# Patient Record
Sex: Female | Born: 1970
Health system: Southern US, Community
[De-identification: ages and names within clinical notes are randomized; demographics above are authoritative.]

## PROBLEM LIST (undated history)

## (undated) DIAGNOSIS — L0291 Cutaneous abscess, unspecified: Secondary | ICD-10-CM

## (undated) DIAGNOSIS — L0390 Cellulitis, unspecified: Secondary | ICD-10-CM

## (undated) DIAGNOSIS — J309 Allergic rhinitis, unspecified: Secondary | ICD-10-CM

## (undated) DIAGNOSIS — Z86718 Personal history of other venous thrombosis and embolism: Secondary | ICD-10-CM

## (undated) DIAGNOSIS — I10 Essential (primary) hypertension: Secondary | ICD-10-CM

## (undated) HISTORY — PX: CHOLECYSTECTOMY: SHX55

## (undated) HISTORY — PX: ABDOMINAL HYSTERECTOMY: SHX81

## (undated) HISTORY — DX: Essential (primary) hypertension: I10

## (undated) HISTORY — DX: Personal history of other venous thrombosis and embolism: Z86.718

## (undated) HISTORY — DX: Allergic rhinitis, unspecified: J30.9

## (undated) HISTORY — PX: KNEE SURGERY: SHX244

---

## 2004-06-03 ENCOUNTER — Encounter (INDEPENDENT_AMBULATORY_CARE_PROVIDER_SITE_OTHER): Payer: Self-pay | Admitting: *Deleted

## 2004-06-03 ENCOUNTER — Inpatient Hospital Stay (HOSPITAL_COMMUNITY): Admission: RE | Admit: 2004-06-03 | Discharge: 2004-06-06 | Payer: Self-pay | Admitting: Obstetrics & Gynecology

## 2006-06-07 ENCOUNTER — Encounter: Admission: RE | Admit: 2006-06-07 | Discharge: 2006-06-07 | Payer: Self-pay | Admitting: Obstetrics & Gynecology

## 2006-08-12 ENCOUNTER — Encounter (INDEPENDENT_AMBULATORY_CARE_PROVIDER_SITE_OTHER): Payer: Self-pay | Admitting: *Deleted

## 2006-08-12 ENCOUNTER — Inpatient Hospital Stay (HOSPITAL_COMMUNITY): Admission: RE | Admit: 2006-08-12 | Discharge: 2006-08-15 | Payer: Self-pay | Admitting: Obstetrics & Gynecology

## 2010-04-18 ENCOUNTER — Ambulatory Visit: Payer: Self-pay | Admitting: Internal Medicine

## 2010-11-14 NOTE — Discharge Summary (Signed)
NAMEADELAI, ACHEY              ACCOUNT NO.:  [id number]   MEDICAL RECORD NO.:  [id number]          PATIENT TYPE:  INP   LOCATION:  9308                          FACILITY:  [facility]   PHYSICIAN:  Freddy Finner, M.D.   DATE OF BIRTH:  01/06/1971   DATE OF ADMISSION:  06/03/2004  DATE OF DISCHARGE:  06/06/2004                                 DISCHARGE SUMMARY   DISCHARGE DIAGNOSIS:  Multiple uterine leiomyomata with total weight of  myomas removed 526 g.   OPERATIVE PROCEDURE:  Myomectomy.   POSTOPERATIVE COMPLICATIONS:  None, although the patient did experience some  slow return of bowel function.   DISPOSITION:  The patient was discharged on postoperative day #3 at which  time she was having adequate bowel and bladder function.  She was ambulating  without difficulty.  She was tolerating a regular diet.  She was discharged  home with Percocet 5/325 mg to be taken as needed for postoperative pain.  She is to continue multivitamins and iron supplement.  She is to follow up  in the office in approximately 2 weeks.  She is to call for fever, for any  drainage or bleeding from the incision, or any major vaginal bleeding.   Details of the present illness, past history, family history, review of  systems, and physical exam recorded in the admission note and/or in the  operative summary.  The patient was found to have a very large uterus with  what at least by ultrasound appeared to be a single large myoma.  She had  been advised to have a hysterectomy by another physician and wished a second  opinion.  After consultation, we have elected to admit her now for  myomectomy as an option.   Laboratory data during this admission includes admission hemoglobin of 12.2,  postoperative hemoglobin of 11.1 on postoperative day #1, 11.4 on  postoperative day #2, 10.8 on postoperative day #3.  Her admission  prothrombin time and PTT and urinalysis were all normal.   The patient was admitted on the  morning of surgery.  She was taken to the  operating room where an exploratory laparotomy was performed with findings  of numerous leiomyomas, not just a single one as anticipated by ultrasound.  A large number of myomas were then excised; in fact, all that were palpable  or visible were removed.  Total mass of that removed tissue 526 g.  Her  postoperative course was uncomplicated although she did have fairly slow  return of GI function which required her an additional hospital day.  She  did, however, remain completely afebrile throughout.  Her incision was  healing well throughout her postoperative stay.  By the morning of  postoperative day #3 her condition was considered to be good and she was  discharged home with disposition as noted above.     Hosie Spangle   WRN/MEDQ  D:  07/03/2004  T:  07/03/2004  Job:  [id number]

## 2010-11-14 NOTE — H&P (Signed)
[name], [name]              ACCOUNT NO.:  [id number]   MEDICAL RECORD NO.:  [id number]          PATIENT TYPE:  INP   LOCATION:  NA                            FACILITY:  [facility]   PHYSICIAN:  Freddy Finner, M.D.   DATE OF BIRTH:  [birth year]   DATE OF ADMISSION:  DATE OF DISCHARGE:                                HISTORY & PHYSICAL   DATE OF ADMISSION:  June 03, 2004   ADMITTING DIAGNOSIS:  Large uterine leiomyoma.   The patient is a 40 year old black single female gravida 1 para 1 who has  been known to have a uterine leiomyoma for approximately 2 years.  She  presented to my office first in September 2005 after it was recommended that  she have a hysterectomy.  She was looking for another option.  Pelvic  ultrasound in the office on that day showed a single intramural myoma  measuring 6.3 x 5.6 cm.  No other myomas were identified.  The additional  option of a myomectomy was discussed with the patient and she has elected to  proceed with this procedure.  She is now admitted for that purpose.  The  small possibility of a hysterectomy has been discussed with the patient in  the presence of hemorrhage requiring this for her safety.   Her current review of systems is otherwise negative.   PAST MEDICAL HISTORY:  The patient has no known significant medical  illnesses.  She did have a cholecystectomy in 1997.  She has had one vaginal  birth.  She has had no other operative procedures.  She has no known  allergies to medications.  She is not a cigarette smoker, does not use drugs  or alcohol.  Her only chronic medication is iron replacement.   FAMILY HISTORY:  Diabetes mellitus is present in the maternal grandmother,  paternal grandmother, and paternal grandfather.  Breast cancer is present in  a maternal great-grandmother.  Heart disease is present in paternal  grandfather, who died with an MI.  Maternal grandmother has some form of  dementia, questionably Alzheimer's.   PHYSICAL EXAMINATION:  HEENT:  Grossly within normal limits.  Thyroid gland  is not palpably enlarged.  VITAL SIGNS:  Blood pressure in the office was 122/84.  CHEST:  Clear to auscultation.  HEART:  Normal sinus rhythm without murmurs, rubs, or gallops.  ABDOMEN:  Soft and nontender without appreciable organomegaly or palpable  masses.  Exam was somewhat compromised by obesity.  PELVIC:  External genitalia, vagina, and cervix are normal to inspection.  Bimanual reveals the uterus to be irregular and 14 weeks size.  There are no  palpable adnexal masses.  The rectal is palpably normal and rectovaginal  exam confirms the above findings.   ASSESSMENT:  Uterine leiomyoma.   PLAN:  Myomectomy.     [name]   WRN/MEDQ  D:  06/02/2004  T:  06/02/2004  Job:  [id number]

## 2010-11-14 NOTE — Discharge Summary (Signed)
[name], [name]              ACCOUNT NO.:  [id number]   MEDICAL RECORD NO.:  [id number]          PATIENT TYPE:  INP   LOCATION:  9132                          FACILITY:  [facility]   PHYSICIAN:  Zelphia Cairo, MD    DATE OF BIRTH:  December 02, 1970   DATE OF ADMISSION:  08/12/2006  DATE OF DISCHARGE:  08/15/2006                               DISCHARGE SUMMARY   ADMITTING DIAGNOSES:  1. Intrauterine pregnancy at term.  2. Surgically scarred uterus from multiple myomectomies.  3. Recurrent leiomyomata.  4. Request for definitive surgical procedure, specifically cesarean      hysterectomy.   DISCHARGE DIAGNOSIS:  Status post cesarean delivery and hysterectomy  with viable female infant.   PROCEDURES:  Cesarean delivery and hysterectomy.   REASON FOR ADMISSION:  Please see dictated H&P.   HOSPITAL COURSE:  The patient was a 40 year old, gravida 2, para 1, who  had multiple myomectomies in the past and was now admitted for surgical  delivery secondary to previous myomectomy.  The patient was at term.  She had also had documented enlargement of the uterus with additional  fibroids at the time of her admission.  The patient was very certain she  did not desire any further pregnancies and had requested a definitive  surgical intervention, specifically requested a hysterectomy at the time  of the cesarean delivery.  On the morning of admission, the patient was taken to the operating room  where spinal anesthesia was administered without difficulty.  A low  transverse incision was made with the delivery of a viable female infant,  weighing 8 pounds 6 ounces, Apgar's of 8 at one minute and 9 at five  minutes.  The patient did undergo a hysterectomy and tolerated procedure  well and was taken to the recovery room in stable condition.  On postoperative day #1, the patient was without complaint.  Vital signs  were stable.  She was afebrile.  The patient did experience some  hypothermia and was  applied a bear hug blanket.  Temperature was now  normal.  On the following morning, abdomen soft with decreased bowel sounds.  Abdominal dressing was noted to be clean, dry, and intact.  The patient  was ambulating well.  Foley had been discontinued.  However, she had not  voided at the time of rounding.  Laboratory findings revealed hemoglobin  stable at 9.2, platelet count 136,000, WBC count of 6.3.  On postoperative day #2, the patient was without complaint.  Vital signs  were stable.  She was afebrile.  Blood pressure 130s over 70s to 80s.  Abdomen soft with good return of bowel function.  Abdominal dressing had  been removed revealing an incision that was clean, dry, and intact.  On postoperative day #3, the patient was without complaint.  Vital signs  remained stable.  She was afebrile.  Abdomen soft.  Incision clean, dry,  and intact.  Instructions were reviewed and the patient was later  discharged home.   CONDITION ON DISCHARGE:  Good.   DIET:  Regular as tolerated.   ACTIVITY:  No heavy lifting, no driving x2  weeks, no vaginal entry.   FOLLOWUP:  Patient to follow up in the office in 1 week for an incision  check.  She is to call for a temperature greater than 100 degrees,  persistent nausea, vomiting, heavy vaginal bleeding, and/or redness or  drainage from the incisional site.   DISCHARGE MEDICATIONS:  1. Percocet 5/325, #30, one p.o. every four to six hours p.r.n.  2. Motrin 600 mg every 6 hours as needed.  3. Prenatal vitamins one p.o. daily.  4. Colace one p.o. daily p.r.n.      Julio Sicks, N.P.      Zelphia Cairo, MD  Electronically Signed    CC/MEDQ  D:  09/17/2006  T:  09/17/2006  Job:  [id number]

## 2010-11-14 NOTE — H&P (Signed)
[name], BOCCHINO              ACCOUNT NO.:  [id number]   MEDICAL RECORD NO.:  [id number]          PATIENT TYPE:  INP   LOCATION:  NA                            [facility]   PHYSICIAN:  Freddy Finner, M.D.   DATE OF BIRTH:  1970/09/26   DATE OF ADMISSION:  DATE OF DISCHARGE:  08/12/2006                              HISTORY & PHYSICAL   ADMITTING DIAGNOSES:  1. Intrauterine pregnancy at term.  2. Surgically scarred uterus from multiple myomectomies.  3. Recurrent leiomyomata.  4. Request for definitive surgical procedure, specifically Cesarean      hysterectomy.   The patient is a 40 year old gravida 2, para 1, who had multiple  myomectomies in the past and is admitted now at term for surgical  delivery because of a myomectomy.  She has documented enlargement of the  uterus with additional fibroids since that time.  She is very certain  that she wishes not to have more pregnancies and has requested  definitive surgical intervention, specifically requested hysterectomy at  the time of Cesarean.  After careful consultation, we have elected to  proceed with this procedure.  She is admitted at this time for that  purpose.   CURRENT REVIEW OF SYSTEMS:  Otherwise negative.  There are no cardiac,  pulmonary, GI, or GU complaints.   PAST MEDICAL HISTORY:  Recorded in detail on the prenatal summary.  Will  not be repeated at this time.   PHYSICAL EXAMINATION:  Blood pressure in the office 128/70.  HEENT:  Grossly within normal limits.  Thyroid gland is not palpably  enlarged.  CHEST:  Clear to auscultation.  HEART:  Normal sinus rhythm without murmurs, rubs, or gallops.  ABDOMEN:  Gravid.  Estimated fetal weight of 8 pounds.  EXTREMITIES:  Without cyanosis, clubbing, or edema.   ASSESSMENT:  1. Intrauterine pregnancy at term.  2. Surgically scarred uterus.  3. History of myomectomy with recurring leiomyomata.  4. Request for permanent sterilization and hysterectomy.   PLAN:  Cesarean, hysterectomy.      Freddy Finner, M.D.  Electronically Signed    WRN/MEDQ  D:  08/11/2006  T:  08/11/2006  Job:  [id number]

## 2010-11-14 NOTE — Op Note (Signed)
[name], [name]              ACCOUNT NO.:  [id number]   MEDICAL RECORD NO.:  [id number]          PATIENT TYPE:  INP   LOCATION:  9132                          [facility]ACILITY:  [facility]   PHYSICIAN:  [name], M.D.   DATE OF BIRTH:  1970-10-15   DATE OF PROCEDURE:  08/12/2006  DATE OF DISCHARGE:                               OPERATIVE REPORT   PREOPERATIVE DIAGNOSES:  1. Intrauterine pregnancy at term.  2. Surgically scarred uterus by previous myomectomy.  3. Recurrent uterine leiomyomata.  4. Multiparity and request for permanent sterilization and request for      hysterectomy.   POSTOPERATIVE DIAGNOSES:  1. Intrauterine pregnancy at term.  2. Surgically scarred uterus by previous myomectomy.  3. Recurrent uterine leiomyomata.  4. Multiparity and request for permanent sterilization and request for      hysterectomy.   OPERATIVE PROCEDURE:  Cesarean, hysterectomy. Delivery of viable female  infant, Apgars of 8 and 9.   ANESTHESIA:  Spinal.   INTRAOPERATIVE COMPLICATIONS:  None.   ESTIMATED INTRAOPERATIVE BLOOD LOSS:  1200 mL.   SURGEON:  [name].   ASSISTANTRenaldo [name].   DETAILS OF THE PRESENT ILLNESS:  According to the admission note, the  patient was admitted on the morning of surgery when she was brought to  the operating room, placed under adequate spinal anesthesia, placed in  the dorsal recumbent position. Abdomen was prepped and draped in the  usual fashion. Foley catheter placed using sterile technique, and  vaginal prep done today because of hysterectomy. A lower abdominal  transverse skin incision was made through an old transverse scar and  carried sharply down to fascia. A subcutaneous vessels were controlled  with the Bovie. Fascia was entered sharply and extended to the extent of  skin incision. Rectus sheath was developed superiorly and inferiorly  with blunt and sharp dissection. Rectus muscles were divided in the  midline. Peritoneum was elevated and  entered sharply and extended  bluntly to the extent of skin incision. Bladder blade was placed.  Transverse incision was made in the visceral peritoneum overlying the  lower uterine segment. Bladder bluntly was dissected off of the lower  segment. Transverse incision was made in the lower uterine segment and  carried sharply to the amniotic fluid which was clear. A Kiwi was then  used to deliver a viable female infant without significant difficulty.  Placenta was donated and was removed manually and passed from the table.  Uterus was then delivered onto the anterior abdominal wall. There were  dense adhesions to the posterior fundus of the ileum, and these were  taken down with the Bovie very carefully. There were adhesions of the  left ovary to the posterior uterine surface. These, too, were taken down  with the Bovie. The uterus was nodular, consistent with myomas. Proximal  broad ligaments were then clamped with large Kelly's. Progressive  pedicles were then developed on each side using curved Heaney's to  develop utero-ovarian pedicle, the broad ligament. Because of the  pregnancy, this required approximately 4 pedicles on each side. Each was  sharply divided and ligated with  suture ligature of 0 Monocryl. Bladder  flap was then carefully released, and the bladder advanced off of the  cervix and lower uterine segment. Vesical pedicles were taken with  curved Heaney's, divided sharply and ligated with 0 Monocryl. Straight  Heaney's were then used to develop the cardinal ligament pedicles which  were sharply divided and ligated with 0 Monocryl. The uterus was then  amputated from the cervix. Digital exploration of the cervix identified  the distal end of the cervix. An additional pedicle on each side was  required with straight Heaney's for complete resection of the cardinal  ligament pedicles, and these were suture ligated with 0 Monocryl. Curved  parametrium clamp was placed on the left  uterosacral across the angle of  the vagina. Using digital exam, the cervix was completely circumscribed  sharply and removed. A couple of small segments of cervix were remaining  on the mucosa and were removed. The vaginal angles were ligated with  suture ligature of 0 Monocryl. Cuff was closed with figure-of-eights of  0 Monocryl. Small bleeding sources were controlled with the Bovie.  Irrigation was carried out. Hemostasis was complete. The right fallopian  tube was in fact a blind tube with no fimbria visible, and for that  reason, it was removed. Two pedicles were required for this, requiring  free ties of 0 Monocryl or suture ligatures of 0 Monocryl. Also a small  right para-ovarian cyst was excised with the Bovie. The appendix could  not be visualized and was presumed to be retrocecal. All pack, needle  and instrument counts were correct. After irrigation, the abdominal  incision was closed in layers. Running 0 Monocryl was used to close the  peritoneum and reapproximate the rectus muscles. Fascia was closed with  running double looped 0 PDS. Subcutaneous tissue was approximated with  running 2-0 plain. Skin was closed with __________  skin staples. The  patient tolerated the operative procedure well and was taken to the  recovery room in good condition.      [name], M.D.  Electronically Signed     WRN/MEDQ  D:  08/12/2006  T:  08/12/2006  Job:  [id number]

## 2010-11-14 NOTE — Op Note (Signed)
[name], [name]              ACCOUNT NO.:  [id number]   MEDICAL RECORD NO.:  [id number]          PATIENT TYPE:  INP   LOCATION:  9308                          FACILITY:  [facility]   PHYSICIAN:  Freddy Finner, M.D.   DATE OF BIRTH:  09-14-1970   DATE OF PROCEDURE:  06/03/2004  DATE OF DISCHARGE:                                 OPERATIVE REPORT   PREOPERATIVE DIAGNOSIS:  Fibroids.   POSTOPERATIVE DIAGNOSIS:  Multiple fibroids.   PROCEDURE:  Myomectomy.   ANESTHESIA:  General endotracheal.   ESTIMATED BLOOD LOSS:  800 mL.   COMPLICATIONS:  None.   INDICATIONS FOR PROCEDURE:  The patient was admitted on the morning of  surgery.  She was placed in PAS hose.  She was brought to the operating room  and there placed under adequate general endotracheal anesthesia and placed  in the dorsal recumbent position.  Abdomen, perineum, and vagina were  prepped in the usual fashion, Foley catheter placed using sterile technique.  Sterile drapes were applied.  Lower abdominal transverse skin incision was  made and carried sharply down to fascia.  The fascia was entered sharply and  extended to the extent of the skin incision.  The rectus sheath was  developed superiorly and inferiorly with blunt and sharp dissection.  Rectus  muscles were divided in the midline.  The peritoneum was entered sharply and  extended bluntly and sharply to the extent of the skin incision.  Bleeding  subcutaneous and subfascial vessels were controlled with the Bovie.  Manual  exploration of the upper abdomen revealed no apparent abnormality of  kidneys, gallbladder, liver.  No other palpable abnormalities were noted.  The appendix was retrocecal, but palpably normal.  The uterus was elevated  into the incision.  The tubes and ovaries were inspected and found to be  normal.  The uterus was irregularly enlarged.  A total of seven myomas were  removed during the course of the operative procedure.  The subserosal  lesions were sharply excised and the defect closed with figure-of-eights of  0 Monocryl.  The deeper larger fibroids were excised with careful blunt and  sharp dissection and deep layers of closure was 0 Monocryl sutures in figure-  of-eight pattern.  Superficial closure was with a 3-0 Monocryl.  After  resection of all of the myomas, adequate hemostasis was achieved.  Irrigation was carried out.  The uterus was placed back into the abdominal  cavity.  Intercede was placed over the large defect at the dome of the  fundus and on the anterior fundal surface.  All needle, sponge, and  instrument counts correct.  Abdomen was closed in layers.  Running 0  Monocryl was used to close the peritoneum and to reapproximated the rectus  muscles.  The fascia was closed with running 0 PDS.  Subcutaneous tissue was  closed with running 2-0 plain suture.  Skin was closed with wide skin  staples.  The patient tolerated the procedure well.  She was awakened and  taken to the recovery room in good condition.     Hosie Spangle  WRN/MEDQ  D:  06/03/2004  T:  06/03/2004  Job:  [id number]

## 2011-10-05 ENCOUNTER — Encounter (HOSPITAL_BASED_OUTPATIENT_CLINIC_OR_DEPARTMENT_OTHER): Payer: Self-pay

## 2011-10-05 DIAGNOSIS — Z23 Encounter for immunization: Secondary | ICD-10-CM | POA: Insufficient documentation

## 2011-10-05 DIAGNOSIS — L02419 Cutaneous abscess of limb, unspecified: Secondary | ICD-10-CM | POA: Insufficient documentation

## 2011-10-05 DIAGNOSIS — L03119 Cellulitis of unspecified part of limb: Secondary | ICD-10-CM | POA: Insufficient documentation

## 2011-10-05 NOTE — ED Notes (Signed)
Redness/swelling to LLE x 1 week-denies bite/exposure

## 2011-10-06 ENCOUNTER — Emergency Department (HOSPITAL_BASED_OUTPATIENT_CLINIC_OR_DEPARTMENT_OTHER)
Admission: EM | Admit: 2011-10-06 | Discharge: 2011-10-06 | Disposition: A | Discharge status: Home / Self Care | Payer: BC Managed Care – PPO | Attending: Emergency Medicine | Admitting: Emergency Medicine

## 2011-10-06 DIAGNOSIS — L0390 Cellulitis, unspecified: Secondary | ICD-10-CM

## 2011-10-06 LAB — CBC
Hemoglobin: 12.6 g/dL (ref 12.0–15.0)
MCHC: 33.4 g/dL (ref 30.0–36.0)
RDW: 13.3 % (ref 11.5–15.5)
WBC: 8.6 10*3/uL (ref 4.0–10.5)

## 2011-10-06 LAB — COMPREHENSIVE METABOLIC PANEL
ALT: 21 U/L (ref 0–35)
AST: 18 U/L (ref 0–37)
Albumin: 3.6 g/dL (ref 3.5–5.2)
Alkaline Phosphatase: 93 U/L (ref 39–117)
CO2: 27 mEq/L (ref 19–32)
Chloride: 103 mEq/L (ref 96–112)
GFR calc non Af Amer: 80 mL/min — ABNORMAL LOW (ref >90)
Potassium: 3.7 mEq/L (ref 3.5–5.1)
Total Bilirubin: 0.3 mg/dL (ref 0.3–1.2)

## 2011-10-06 LAB — URINALYSIS, ROUTINE W REFLEX MICROSCOPIC
Glucose, UA: NEGATIVE mg/dL
Hgb urine dipstick: NEGATIVE
Ketones, ur: NEGATIVE mg/dL
Protein, ur: NEGATIVE mg/dL

## 2011-10-06 LAB — DIFFERENTIAL
Basophils Absolute: 0 10*3/uL (ref 0.0–0.1)
Basophils Relative: 0 % (ref 0–1)
Lymphocytes Relative: 27 % (ref 12–46)
Neutro Abs: 5.3 10*3/uL (ref 1.7–7.7)
Neutrophils Relative %: 62 % (ref 43–77)

## 2011-10-06 MED ORDER — TRAMADOL HCL 50 MG PO TABS: 50.0000 mg | ORAL_TABLET | Freq: Four times a day (QID) | ORAL | Status: AC | PRN

## 2011-10-06 MED ORDER — TETANUS-DIPHTH-ACELL PERTUSSIS 5-2.5-18.5 LF-MCG/0.5 IM SUSP
0.5000 mL | Freq: Once | INTRAMUSCULAR | Status: AC
  Administered 2011-10-06: 0.5 mL via INTRAMUSCULAR
  Filled 2011-10-06: qty 0.5

## 2011-10-06 MED ORDER — CEFTRIAXONE SODIUM 1 G IJ SOLR
1.0000 g | Freq: Once | INTRAMUSCULAR | Status: AC
  Administered 2011-10-06: 1 g via INTRAMUSCULAR
  Filled 2011-10-06: qty 10

## 2011-10-06 MED ORDER — CEPHALEXIN 500 MG PO CAPS: 500.0000 mg | ORAL_CAPSULE | Freq: Four times a day (QID) | ORAL | Status: AC

## 2011-10-06 MED ORDER — DOXYCYCLINE HYCLATE 100 MG PO CAPS: 100.0000 mg | ORAL_CAPSULE | Freq: Two times a day (BID) | ORAL | Status: AC

## 2011-10-06 NOTE — ED Provider Notes (Signed)
History   This chart was scribed for [name] Defrank Smitty Cords, MD by [name]. The patient was seen in room MH06/MH06 and the patient's care was started at 12:36 AM     CSN: [id number]  Arrival date & time 10/05/11  2127   None     Chief Complaint  Patient presents with  . Cellulitis    (Consider location/radiation/quality/duration/timing/severity/associated sxs/prior treatment) Patient is a 41 y.o. female presenting with rash. The history is provided by the patient. No language interpreter was used.  Rash  This is a new problem. The current episode started more than 1 week ago. The problem has not changed since onset.The problem is associated with nothing. There has been no fever. The rash is present on the left lower leg. The pain is at a severity of 7/10. The pain is severe. The pain has been constant since onset. Associated symptoms include pain. Pertinent negatives include no blisters, no itching and no weeping. She has tried nothing for the symptoms. The treatment provided no relief.    [name] is a 41 y.o. female who presents to the Emergency Department complaining of moderate, episodic cellulitis located on the LLE onset one week ago with associated symptoms of swelling, erythema. Pt states "she began getting little bumps which show up on her legs and progressively get larger and burst on her back and buttocks." Pt has a hx of allergies to sulfa antibiotics, abdominal hysterectomy, cholecystectomy, and knee surgery.  No f/c/r. No n/v/d  Pt denies diabetes, taking birth control.   Past Surgical History  Procedure Date  . Abdominal hysterectomy   . Cholecystectomy   . Knee surgery       History  Substance Use Topics  . Smoking status: Never Smoker   . Smokeless tobacco: Not on file  . Alcohol Use: No    OB History    Grav Para Term Preterm Abortions TAB SAB Ect Mult Living                  Review of Systems  Constitutional: Negative.   HENT: Negative.     Eyes: Negative.   Respiratory: Negative.   Cardiovascular: Negative for chest pain.  Gastrointestinal: Negative for abdominal distention.  Genitourinary: Negative.   Skin: Positive for color change and rash. Negative for itching.  Neurological: Negative.   Hematological: Negative.  Negative for adenopathy.  Psychiatric/Behavioral: Negative.   All other systems reviewed and are negative.    10 Systems reviewed and all are negative for acute change except as noted in the HPI.    Allergies  Sulfa antibiotics  Home Medications  No current outpatient prescriptions on file.  BP 143/99  Pulse 94  Temp(Src) 98.8 F (37.1 C) (Oral)  Resp 16  Ht 5\' 4"  (1.626 m)  Wt 280 lb (127.007 kg)  BMI 48.06 kg/m2  SpO2 99%  Physical Exam  Nursing note and vitals reviewed. Constitutional: She is oriented to person, place, and time. She appears well-developed and well-nourished.  HENT:  Right Ear: External ear normal.  Left Ear: External ear normal.  Nose: Nose normal.  Eyes: EOM are normal. Pupils are equal, round, and reactive to light.  Neck: Normal range of motion. No JVD present. No tracheal deviation present.  Cardiovascular: Normal rate, regular rhythm, normal heart sounds and intact distal pulses.  Exam reveals no gallop and no friction rub.   No murmur heard. Pulmonary/Chest: Effort normal.  Abdominal: Soft. Bowel sounds are normal. She exhibits no distension.  There is no tenderness.  Musculoskeletal: Normal range of motion. She exhibits no tenderness.  Lymphadenopathy:    She has no cervical adenopathy.       Right: No inguinal adenopathy present.       Left: No inguinal adenopathy present.  Neurological: She is alert and oriented to person, place, and time.  Skin: Skin is warm and dry. There is erythema.       Cellulitis 15X9 cm ovoid area on right shin, no fluctuance, erythematous, warm. Marked with surgical marking pan  Psychiatric: She has a normal mood and affect. Her  behavior is normal.    ED Course  Procedures (including critical care time)  DIAGNOSTIC STUDIES: Oxygen Saturation is 99% on room air, normal by my interpretation.    COORDINATION OF CARE:     Results for orders placed during the hospital encounter of 10/06/11  CBC      Component Value Range   WBC 8.6  4.0 - 10.5 (K/uL)   RBC 4.36  3.87 - 5.11 (MIL/uL)   Hemoglobin 12.6  12.0 - 15.0 (g/dL)   HCT 16.1  09.6 - 04.5 (%)   MCV 86.5  78.0 - 100.0 (fL)   MCH 28.9  26.0 - 34.0 (pg)   MCHC 33.4  30.0 - 36.0 (g/dL)   RDW 40.9  81.1 - 91.4 (%)   Platelets 279  150 - 400 (K/uL)  DIFFERENTIAL      Component Value Range   Neutrophils Relative 62  43 - 77 (%)   Neutro Abs 5.3  1.7 - 7.7 (K/uL)   Lymphocytes Relative 27  12 - 46 (%)   Lymphs Abs 2.3  0.7 - 4.0 (K/uL)   Monocytes Relative 10  3 - 12 (%)   Monocytes Absolute 0.9  0.1 - 1.0 (K/uL)   Eosinophils Relative 2  0 - 5 (%)   Eosinophils Absolute 0.1  0.0 - 0.7 (K/uL)   Basophils Relative 0  0 - 1 (%)   Basophils Absolute 0.0  0.0 - 0.1 (K/uL)  COMPREHENSIVE METABOLIC PANEL      Component Value Range   Sodium 139  135 - 145 (mEq/L)   Potassium 3.7  3.5 - 5.1 (mEq/L)   Chloride 103  96 - 112 (mEq/L)   CO2 27  19 - 32 (mEq/L)   Glucose, Bld 116 (*) 70 - 99 (mg/dL)   BUN 9  6 - 23 (mg/dL)   Creatinine, Ser 7.82  0.50 - 1.10 (mg/dL)   Calcium 9.5  8.4 - 95.6 (mg/dL)   Total Protein 8.0  6.0 - 8.3 (g/dL)   Albumin 3.6  3.5 - 5.2 (g/dL)   AST 18  0 - 37 (U/L)   ALT 21  0 - 35 (U/L)   Alkaline Phosphatase 93  39 - 117 (U/L)   Total Bilirubin 0.3  0.3 - 1.2 (mg/dL)   GFR calc non Af Amer 80 (*) >90 (mL/min)   GFR calc Af Amer >90  >90 (mL/min)  URINALYSIS, ROUTINE W REFLEX MICROSCOPIC      Component Value Range   Color, Urine AMBER (*) YELLOW    APPearance CLEAR  CLEAR    Specific Gravity, Urine 1.036 (*) 1.005 - 1.030    pH 5.5  5.0 - 8.0    Glucose, UA NEGATIVE  NEGATIVE (mg/dL)   Hgb urine dipstick NEGATIVE  NEGATIVE     Bilirubin Urine SMALL (*) NEGATIVE    Ketones, ur NEGATIVE  NEGATIVE (mg/dL)  Protein, ur NEGATIVE  NEGATIVE (mg/dL)   Urobilinogen, UA 1.0  0.0 - 1.0 (mg/dL)   Nitrite NEGATIVE  NEGATIVE    Leukocytes, UA NEGATIVE  NEGATIVE   PREGNANCY, URINE      Component Value Range   Preg Test, Ur NEGATIVE  NEGATIVE    No results found.    No diagnosis found.   12:37PM- EDP at bedside discusses treatment plan.   MDM  Return in 2 days for recheck, take all antibiotics as directed. Use barrier protection for one pill cycle while on antibiotics as abx can invalidate contraceptives.  Return sooner for fevers, chills, vomiting diarrhea streaking up the leg progression in cellulitis or any concerns.  Patient verbalizes understanding and agrees to follow up     I personally performed the services described in this documentation, which was scribed in my presence. The recorded information has been reviewed and considered.    Jasmine Awe, MD 10/06/11 [phone]

## 2011-10-06 NOTE — Discharge Instructions (Signed)
Cellulitis Cellulitis is an infection of the tissue under the skin. The infected area is usually red and tender. This is caused by germs. These germs enter the body through cuts or sores. This usually happens in the arms or lower legs. HOME CARE   Take your medicine as told. Finish it even if you start to feel better.   If the infection is on the arm or leg, keep it raised (elevated).   Use a warm cloth on the infected area several times a day.   See your doctor for a follow-up visit as told.  GET HELP RIGHT AWAY IF:   You are tired or confused.   You throw up (vomit).   You have watery poop (diarrhea).   You feel ill and have muscle aches.   You have a fever.  MAKE SURE YOU:   Understand these instructions.   Will watch your condition.   Will get help right away if you are not doing well or get worse.  Document Released: 12/02/2007 Document Revised: 06/04/2011 Document Reviewed: 05/17/2009 [employer] Patient Information 2012 [employer]. 

## 2011-10-07 ENCOUNTER — Encounter (HOSPITAL_BASED_OUTPATIENT_CLINIC_OR_DEPARTMENT_OTHER): Payer: Self-pay

## 2011-10-07 ENCOUNTER — Emergency Department (HOSPITAL_BASED_OUTPATIENT_CLINIC_OR_DEPARTMENT_OTHER)
Admission: EM | Admit: 2011-10-07 | Discharge: 2011-10-07 | Disposition: A | Discharge status: Home / Self Care | Payer: BC Managed Care – PPO | Attending: Emergency Medicine | Admitting: Emergency Medicine

## 2011-10-07 DIAGNOSIS — L0390 Cellulitis, unspecified: Secondary | ICD-10-CM

## 2011-10-07 DIAGNOSIS — L03119 Cellulitis of unspecified part of limb: Secondary | ICD-10-CM | POA: Insufficient documentation

## 2011-10-07 DIAGNOSIS — L02419 Cutaneous abscess of limb, unspecified: Secondary | ICD-10-CM | POA: Insufficient documentation

## 2011-10-07 NOTE — ED Notes (Signed)
Redness, swelling, drainage and pain noted to left LE. Seen for similar symptoms and started on PO ABX

## 2011-10-07 NOTE — ED Provider Notes (Signed)
History     CSN: [id number]  Arrival date & time 10/07/11  4098   First MD Initiated Contact with Patient 10/07/11 1010      Chief Complaint  Patient presents with  . Wound Check  . Wound Infection    (Consider location/radiation/quality/duration/timing/severity/associated sxs/prior treatment) HPI Comments: Patient was seen here 2 days ago for cellulitis on her left lower leg.  There is no abscess at that time.  Patient was discharged with Keflex and doxycycline and patient has been taking his medications as prescribed.  The area had been marked with a marking pen in the redness is receding.  Patient notes that this morning she did have a pustule that opened up and is beginning to drain as well.  She notes no fevers.  She does feel that the redness is improving.  Patient is a 41 y.o. female presenting with wound check. The history is provided by the patient. No language interpreter was used.  Wound Check  She was treated in the ED 2 to 3 days ago. Previous treatment in the ED includes oral antibiotics.    History reviewed. No pertinent past medical history.  Past Surgical History  Procedure Date  . Abdominal hysterectomy   . Cholecystectomy   . Knee surgery     History reviewed. No pertinent family history.  History  Substance Use Topics  . Smoking status: Never Smoker   . Smokeless tobacco: Not on file  . Alcohol Use: No    OB History    Grav Para Term Preterm Abortions TAB SAB Ect Mult Living                  Review of Systems  Constitutional: Negative.  Negative for fever and chills.  HENT: Negative.   Eyes: Negative.  Negative for discharge and redness.  Respiratory: Negative.  Negative for cough and shortness of breath.   Cardiovascular: Negative.  Negative for chest pain.  Gastrointestinal: Negative.  Negative for nausea, vomiting, abdominal pain and diarrhea.  Genitourinary: Negative.  Negative for dysuria and vaginal discharge.  Musculoskeletal:  Negative.  Negative for back pain.  Skin: Positive for color change and wound. Negative for rash.  Neurological: Negative.  Negative for syncope and headaches.  Hematological: Negative.  Negative for adenopathy.  Psychiatric/Behavioral: Negative.  Negative for confusion.  All other systems reviewed and are negative.    Allergies  Sulfa antibiotics  Home Medications   Current Outpatient Rx  Name Route Sig Dispense Refill  . CEPHALEXIN 500 MG PO CAPS Oral Take 1 capsule (500 mg total) by mouth 4 (four) times daily. 28 capsule 0  . DOXYCYCLINE HYCLATE 100 MG PO CAPS Oral Take 1 capsule (100 mg total) by mouth 2 (two) times daily. One po bid x 7 days 14 capsule 0  . TRAMADOL HCL 50 MG PO TABS Oral Take 1 tablet (50 mg total) by mouth every 6 (six) hours as needed for pain. 15 tablet 0    BP 135/75  Pulse 72  Temp(Src) 97.4 F (36.3 C) (Oral)  Resp 20  Ht 5\' 4"  (1.626 m)  Wt 280 lb (127.007 kg)  BMI 48.06 kg/m2  SpO2 97%  Physical Exam  Nursing note and vitals reviewed. Constitutional: She is oriented to person, place, and time. She appears well-developed and well-nourished.  Non-toxic appearance. She does not have a sickly appearance.  HENT:  Head: Normocephalic and atraumatic.  Eyes: Conjunctivae, EOM and lids are normal. Pupils are equal, round, and reactive to  light. No scleral icterus.  Neck: Trachea normal and normal range of motion. Neck supple.  Cardiovascular: Normal rate, regular rhythm and normal heart sounds.   Abdominal: Normal appearance. There is no CVA tenderness.  Musculoskeletal: Normal range of motion.       Legs: Neurological: She is alert and oriented to person, place, and time. She has normal strength.  Skin: Skin is warm, dry and intact. No rash noted. There is erythema.  Psychiatric: She has a normal mood and affect. Her behavior is normal. Judgment and thought content normal.    ED Course  Procedures (including critical care time)  Labs Reviewed -  No data to display No results found.   No diagnosis found.    MDM  Patient with improvement in her cellulitis symptoms from prior visit.  She now has a small area of abscess that is draining.  There is no significant fluctuance at the location to warrant formal incision and drainage at this time.  As the patient's symptoms are improving I've advised her to continue her antibiotic therapy as already instructed with doxycycline and Keflex.  She knows to return if the area of redness is worsening instead of improving.  We also rediscussed warm compresses and soaks.        Nat Christen, MD 10/07/11 1028

## 2011-10-07 NOTE — Discharge Instructions (Signed)

## 2011-12-03 ENCOUNTER — Encounter (HOSPITAL_BASED_OUTPATIENT_CLINIC_OR_DEPARTMENT_OTHER): Payer: Self-pay | Admitting: *Deleted

## 2011-12-03 ENCOUNTER — Emergency Department (HOSPITAL_BASED_OUTPATIENT_CLINIC_OR_DEPARTMENT_OTHER)
Admission: EM | Admit: 2011-12-03 | Discharge: 2011-12-03 | Disposition: A | Discharge status: Home / Self Care | Payer: BC Managed Care – PPO | Attending: Emergency Medicine | Admitting: Emergency Medicine

## 2011-12-03 DIAGNOSIS — L02219 Cutaneous abscess of trunk, unspecified: Secondary | ICD-10-CM | POA: Insufficient documentation

## 2011-12-03 DIAGNOSIS — L03319 Cellulitis of trunk, unspecified: Secondary | ICD-10-CM | POA: Insufficient documentation

## 2011-12-03 DIAGNOSIS — IMO0002 Reserved for concepts with insufficient information to code with codable children: Secondary | ICD-10-CM | POA: Insufficient documentation

## 2011-12-03 DIAGNOSIS — R599 Enlarged lymph nodes, unspecified: Secondary | ICD-10-CM | POA: Insufficient documentation

## 2011-12-03 HISTORY — DX: Cutaneous abscess, unspecified: L02.91

## 2011-12-03 HISTORY — DX: Cellulitis, unspecified: L03.90

## 2011-12-03 MED ORDER — CEPHALEXIN 500 MG PO CAPS: 500.0000 mg | ORAL_CAPSULE | Freq: Four times a day (QID) | ORAL | Status: DC

## 2011-12-03 MED ORDER — DOXYCYCLINE HYCLATE 100 MG PO CAPS: 100.0000 mg | ORAL_CAPSULE | Freq: Two times a day (BID) | ORAL | Status: AC

## 2011-12-03 MED ORDER — DOXYCYCLINE HYCLATE 100 MG PO CAPS: 100.0000 mg | ORAL_CAPSULE | Freq: Two times a day (BID) | ORAL | Status: DC

## 2011-12-03 MED ORDER — CEPHALEXIN 500 MG PO CAPS: 500.0000 mg | ORAL_CAPSULE | Freq: Four times a day (QID) | ORAL | Status: AC

## 2011-12-03 NOTE — Discharge Instructions (Signed)

## 2011-12-03 NOTE — ED Notes (Signed)
C/o abscess to abd area and right axilla. Pt denies drainage or fever.

## 2011-12-03 NOTE — ED Provider Notes (Signed)
Medical screening examination/treatment/procedure(s) were performed by non-physician practitioner and as supervising physician I was immediately available for consultation/collaboration.   Dayton Bailiff, MD 12/03/11 2253

## 2011-12-03 NOTE — ED Provider Notes (Signed)
History     CSN: [id number]  Arrival date & time 12/03/11  2049   First MD Initiated Contact with Patient 12/03/11 2152      Chief Complaint  Patient presents with  . Abscess    (Consider location/radiation/quality/duration/timing/severity/associated sxs/prior treatment) Patient is a 41 y.o. female presenting with abscess. The history is provided by the patient. No language interpreter was used.  Abscess  This is a new problem. The current episode started less than one week ago. The onset was gradual. The problem has been unchanged. The abscess is present on the abdomen and right arm. The problem is moderate. The abscess is characterized by redness and painfulness. Associated with: nothing. The abscess first occurred at home. Her past medical history is significant for skin abscesses in family. There were no sick contacts.   patient reports she had cellulitis to her left leg in April. Patient complains of a pustule to right lower abdomen and a swollen area to right axilla. Patient reports her son had MRSA several years ago.  Past Medical History  Diagnosis Date  . Cellulitis and abscess     Past Surgical History  Procedure Date  . Abdominal hysterectomy   . Cholecystectomy   . Knee surgery     History reviewed. No pertinent family history.  History  Substance Use Topics  . Smoking status: Never Smoker   . Smokeless tobacco: Not on file  . Alcohol Use: No    OB History    Grav Para Term Preterm Abortions TAB SAB Ect Mult Living                  Review of Systems  Skin: Positive for wound.  All other systems reviewed and are negative.    Allergies  Sulfa antibiotics  Home Medications   Current Outpatient Rx  Name Route Sig Dispense Refill  . HYDROCORTISONE 1 % EX CREA Topical Apply 1 application topically 2 (two) times daily.      BP 157/97  Pulse 71  Temp(Src) 98.3 F (36.8 C) (Oral)  Resp 18  Ht 5\' 4"  (1.626 m)  Wt 280 lb (127.007 kg)  BMI 48.06  kg/m2  SpO2 95%  Physical Exam  Constitutional: She is oriented to person, place, and time. She appears well-developed and well-nourished.  HENT:  Head: Normocephalic and atraumatic.  Abdominal: Soft.  Musculoskeletal: Normal range of motion.  Neurological: She is alert and oriented to person, place, and time. She has normal reflexes.  Skin: Skin is dry. There is erythema.       1 cm pustule right lower abdomen. Erythematous area of right axilla with surrounding lymphadenopathy.  Psychiatric: She has a normal mood and affect.    ED Course  Procedures (including critical care time)  Labs Reviewed - No data to display No results found.   No diagnosis found.    MDM  I opened abdominal pustule with 20-gauge needle and obtained culture. Patient is given a prescription for Keflex and doxycycline she is advised to see her primary care physician for recheck in 2 days she is to soak areas patient is advised to return here for any fever increased redness or pain.        Lonia Skinner [place], [place] 12/03/11 2208

## 2011-12-06 LAB — WOUND CULTURE

## 2011-12-06 NOTE — ED Notes (Signed)
Abscess culture + MRSA. Treated with Doxycycline, sensitive to same per protocol MD. Will contact patient with results.

## 2011-12-06 NOTE — ED Notes (Signed)
Attempt to contact patient by phone unsuccessful. # provided not in service. Letter sent.

## 2015-04-21 ENCOUNTER — Emergency Department (HOSPITAL_BASED_OUTPATIENT_CLINIC_OR_DEPARTMENT_OTHER)
Admission: EM | Admit: 2015-04-21 | Discharge: 2015-04-21 | Disposition: A | Discharge status: Home / Self Care | Payer: Medicaid Other | Attending: Emergency Medicine | Admitting: Emergency Medicine

## 2015-04-21 ENCOUNTER — Encounter (HOSPITAL_BASED_OUTPATIENT_CLINIC_OR_DEPARTMENT_OTHER): Payer: Self-pay | Admitting: *Deleted

## 2015-04-21 ENCOUNTER — Emergency Department (HOSPITAL_BASED_OUTPATIENT_CLINIC_OR_DEPARTMENT_OTHER): Payer: Medicaid Other

## 2015-04-21 DIAGNOSIS — N12 Tubulo-interstitial nephritis, not specified as acute or chronic: Secondary | ICD-10-CM

## 2015-04-21 DIAGNOSIS — Z872 Personal history of diseases of the skin and subcutaneous tissue: Secondary | ICD-10-CM | POA: Diagnosis not present

## 2015-04-21 DIAGNOSIS — Z7952 Long term (current) use of systemic steroids: Secondary | ICD-10-CM | POA: Diagnosis not present

## 2015-04-21 DIAGNOSIS — R509 Fever, unspecified: Secondary | ICD-10-CM | POA: Diagnosis present

## 2015-04-21 LAB — CBC WITH DIFFERENTIAL/PLATELET
Basophils Absolute: 0 K/uL (ref 0.0–0.1)
Basophils Relative: 0 %
Eosinophils Absolute: 0 K/uL (ref 0.0–0.7)
Eosinophils Relative: 0 %
HCT: 37.3 % (ref 36.0–46.0)
Hemoglobin: 12 g/dL (ref 12.0–15.0)
Lymphocytes Relative: 14 %
Lymphs Abs: 1.5 K/uL (ref 0.7–4.0)
MCH: 28.4 pg (ref 26.0–34.0)
MCHC: 32.2 g/dL (ref 30.0–36.0)
MCV: 88.2 fL (ref 78.0–100.0)
Monocytes Absolute: 1.5 K/uL — ABNORMAL HIGH (ref 0.1–1.0)
Monocytes Relative: 15 %
Neutro Abs: 7.5 K/uL (ref 1.7–7.7)
Neutrophils Relative %: 71 %
Platelets: 292 K/uL (ref 150–400)
RBC: 4.23 MIL/uL (ref 3.87–5.11)
RDW: 13.5 % (ref 11.5–15.5)
WBC: 10.6 K/uL — ABNORMAL HIGH (ref 4.0–10.5)

## 2015-04-21 LAB — URINALYSIS, ROUTINE W REFLEX MICROSCOPIC
Bilirubin Urine: NEGATIVE
Glucose, UA: NEGATIVE mg/dL
Ketones, ur: 15 mg/dL — AB
NITRITE: POSITIVE — AB
PH: 6 (ref 5.0–8.0)
Protein, ur: 30 mg/dL — AB
SPECIFIC GRAVITY, URINE: 1.022 (ref 1.005–1.030)
UROBILINOGEN UA: 4 mg/dL — AB (ref 0.0–1.0)

## 2015-04-21 LAB — BASIC METABOLIC PANEL WITH GFR
Anion gap: 6 (ref 5–15)
BUN: 9 mg/dL (ref 6–20)
CO2: 25 mmol/L (ref 22–32)
Calcium: 9 mg/dL (ref 8.9–10.3)
Chloride: 105 mmol/L (ref 101–111)
Creatinine, Ser: 0.97 mg/dL (ref 0.44–1.00)
GFR calc Af Amer: >60 mL/min
GFR calc non Af Amer: >60 mL/min
Glucose, Bld: 121 mg/dL — ABNORMAL HIGH (ref 65–99)
Potassium: 3.7 mmol/L (ref 3.5–5.1)
Sodium: 136 mmol/L (ref 135–145)

## 2015-04-21 LAB — URINE MICROSCOPIC-ADD ON

## 2015-04-21 LAB — RAPID STREP SCREEN (MED CTR MEBANE ONLY): Streptococcus, Group A Screen (Direct): NEGATIVE

## 2015-04-21 LAB — MONONUCLEOSIS SCREEN: Mono Screen: NEGATIVE

## 2015-04-21 LAB — I-STAT CG4 LACTIC ACID, ED: Lactic Acid, Venous: 1.37 mmol/L (ref 0.5–2.0)

## 2015-04-21 MED ORDER — ACETAMINOPHEN 500 MG PO TABS
1000.0000 mg | ORAL_TABLET | Freq: Once | ORAL | Status: AC
  Administered 2015-04-21: 1000 mg via ORAL
  Filled 2015-04-21: qty 2

## 2015-04-21 MED ORDER — CEFTRIAXONE SODIUM 1 G IJ SOLR
1.0000 g | Freq: Once | INTRAMUSCULAR | Status: AC
  Administered 2015-04-21: 1 g via INTRAVENOUS

## 2015-04-21 MED ORDER — SODIUM CHLORIDE 0.9 % IV BOLUS (SEPSIS)
500.0000 mL | Freq: Once | INTRAVENOUS | Status: AC
  Administered 2015-04-21: 500 mL via INTRAVENOUS

## 2015-04-21 MED ORDER — IBUPROFEN 400 MG PO TABS
600.0000 mg | ORAL_TABLET | Freq: Once | ORAL | Status: AC
  Administered 2015-04-21: 600 mg via ORAL
  Filled 2015-04-21 (×2): qty 1

## 2015-04-21 MED ORDER — CEFTRIAXONE SODIUM 1 G IJ SOLR
INTRAMUSCULAR | Status: AC
  Filled 2015-04-21: qty 10

## 2015-04-21 MED ORDER — SODIUM CHLORIDE 0.9 % IV BOLUS (SEPSIS)
1000.0000 mL | Freq: Once | INTRAVENOUS | Status: AC
  Administered 2015-04-21: 1000 mL via INTRAVENOUS

## 2015-04-21 MED ORDER — CIPROFLOXACIN HCL 500 MG PO TABS: 500.0000 mg | ORAL_TABLET | Freq: Two times a day (BID) | ORAL | Status: DC

## 2015-04-21 NOTE — ED Notes (Signed)
Patient transported to X-ray 

## 2015-04-21 NOTE — ED Notes (Signed)
Pt reports fever, generalized body aches, bilateral breast pain, back pain x 1 week.  Denies cough, urinary symptoms.

## 2015-04-21 NOTE — Discharge Instructions (Signed)
Pyelonephritis, Adult °Pyelonephritis is a kidney infection. The kidneys are the organs that filter a person's blood and move waste out of the bloodstream and into the urine. Urine passes from the kidneys, through the ureters, and into the bladder. There are two main types of pyelonephritis: °· Infections that come on quickly without any warning (acute pyelonephritis). °· Infections that last for a long period of time (chronic pyelonephritis). °In most cases, the infection clears up with treatment and does not cause further problems. More severe infections or chronic infections can sometimes spread to the bloodstream or lead to other problems with the kidneys. °CAUSES °This condition is usually caused by: °· Bacteria traveling from the bladder to the kidney through infected urine. The urine in the bladder can become infected with bacteria from: °¨ Bladder infection (cystitis). °¨ Inflammation of the prostate gland (prostatitis). °¨ Sexual intercourse, in females. °· Bacteria traveling from the bloodstream to the kidney. °RISK FACTORS °This condition is more likely to develop in: °· Pregnant women. °· Older people. °· People who have diabetes. °· People who have kidney stones or bladder stones. °· People who have other abnormalities of the kidney or ureter. °· People who have a catheter placed in the bladder. °· People who have cancer. °· People who are sexually active. °· Women who use spermicides. °· People who have had a prior urinary tract infection. °SYMPTOMS °Symptoms of this condition include: °· Frequent urination. °· Strong or persistent urge to urinate. °· Burning or stinging when urinating. °· Abdominal pain. °· Back pain. °· Pain in the side or flank area. °· Fever. °· Chills. °· Blood in the urine, or dark urine. °· Nausea. °· Vomiting. °DIAGNOSIS °This condition may be diagnosed based on: °· Medical history and physical exam. °· Urine tests. °· Blood tests. °You may also have imaging tests of the  kidneys, such as an ultrasound or CT scan. °TREATMENT °Treatment for this condition may depend on the severity of the infection. °· If the infection is mild and is found early, you may be treated with antibiotic medicines taken by mouth. You will need to drink fluids to remain hydrated. °· If the infection is more severe, you may need to stay in the hospital and receive antibiotics given directly into a vein through an IV tube. You may also need to receive fluids through an IV tube if you are not able to remain hydrated. After your hospital stay, you may need to take oral antibiotics for a period of time. °Other treatments may be required, depending on the cause of the infection. °HOME CARE INSTRUCTIONS °Medicines °· Take over-the-counter and prescription medicines only as told by your health care provider. °· If you were prescribed an antibiotic medicine, take it as told by your health care provider. Do not stop taking the antibiotic even if you start to feel better. °General Instructions °· Drink enough fluid to keep your urine clear or pale yellow. °· Avoid caffeine, tea, and carbonated beverages. They tend to irritate the bladder. °· Urinate often. Avoid holding in urine for long periods of time. °· Urinate before and after sex. °· After a bowel movement, women should cleanse from front to back. Use each tissue only once. °· Keep all follow-up visits as told by your health care provider. This is important. °SEEK MEDICAL CARE IF: °· Your symptoms do not get better after 2 days of treatment. °· Your symptoms get worse. °· You have a fever. °SEEK IMMEDIATE MEDICAL CARE IF: °· You   are unable to take your antibiotics or fluids.  You have shaking chills.  You vomit.  You have severe flank or back pain.  You have extreme weakness or fainting.   This information is not intended to replace advice given to you by your health care provider. Make sure you discuss any questions you have with your health care  provider.  Follow up with her primary care provider in the next 3-4 days for reevaluation. If you experience worsening of your symptoms, burning or stinging when your peak, returning of her fever, vomiting, shortness of breath or chest pain please return to the emergency department.

## 2015-04-24 LAB — URINE CULTURE
Culture: >=100000
Special Requests: NORMAL

## 2015-04-24 LAB — CULTURE, GROUP A STREP: Strep A Culture: NEGATIVE

## 2015-04-25 ENCOUNTER — Telehealth (HOSPITAL_COMMUNITY): Payer: Self-pay

## 2015-04-25 NOTE — Telephone Encounter (Signed)
Post ED Visit - Positive Culture Follow-up  Culture report reviewed by antimicrobial stewardship pharmacist:  []  Celedonio MiyamotoJeremy Frens, Pharm.D., BCPS []  Georgina PillionElizabeth Martin, Pharm.D., BCPS []  Tees TohMinh Pham, [place]Pharm[place]D., BCPS, AAHIVP []  Estella HuskMichelle Turner, Pharm.D., BCPS, AAHIVP []  Janesvilleristy Reyes, [address]Pharm[address] [x]  Mendel CorningNick G. Pharm D [place] Mustassie Stewart, Pharm.D.  Positive urine culture Treated with cipro, organism sensitive to the same and no further patient follow-up is required at this time.  Ashley JacobsFesterman, Joshu Furukawa C 04/25/2015, 10:18 AM

## 2015-04-27 LAB — CULTURE, BLOOD (ROUTINE X 2)
Culture: NO GROWTH
Culture: NO GROWTH

## 2015-04-27 NOTE — ED Provider Notes (Signed)
CSN: [id number]     Arrival date & time 04/21/15  1659 History   First MD Initiated Contact with Patient 04/21/15 1754     Chief Complaint  Patient presents with  . Fever     (Consider location/radiation/quality/duration/timing/severity/associated sxs/prior Treatment) HPI    [name] is a 44 y.o F with no significant pmhx who presents to the Ed c/o fever, sweating, and generalized body aches x 1 week. Pt states that she began running a fever up to 103, 7 days ago. Pt has been taking tylenol and ibuprofen around the clock but has not been able to bring her fever down. Pt also states that she has not been able to stop sweating since a week ago. Pt is actively sweating in ED. Only other associated symptom is generalized body aches. Denies dysuria, cough, SOB, chest pain, dizziness, sore throat, syncope, numbness, tingling, abdominal pain, vomiting, diarrhea, swollen joint, joint pain, ear pain, visual changes, vaginal discharge/bleeding, neck stiffness. No history of IV drug use.      Past Medical History  Diagnosis Date  . Cellulitis and abscess    Past Surgical History  Procedure Laterality Date  . Abdominal hysterectomy    . Cholecystectomy    . Knee surgery     History reviewed. No pertinent family history. Social History  Substance Use Topics  . Smoking status: Never Smoker   . Smokeless tobacco: None  . Alcohol Use: No   OB History    No data available     Review of Systems  All other systems reviewed and are negative.     Allergies  Sulfa antibiotics  Home Medications   Prior to Admission medications   Medication Sig Start Date End Date Taking? Authorizing Provider  ciprofloxacin (CIPRO) 500 MG tablet Take 1 tablet (500 mg total) by mouth 2 (two) times daily. 04/21/15   Dilynn Munroe Tripp Willy Vorce, PA-C  hydrocortisone cream 1 % Apply 1 application topically 2 (two) times daily.    Historical Provider, MD   BP 127/81 mmHg  Pulse 85  Temp(Src) 98.7 F  (37.1 C) (Oral)  Resp 18  Ht  (1.651 m)  Wt 297 lb (134.718 kg)  BMI 49.42 kg/m2  SpO2 98% Physical Exam  Constitutional: She is oriented to person, place, and time. She appears well-developed and well-nourished. No distress.  Pt is dripping with sweat upon exam.   HENT:  Head: Normocephalic and atraumatic.  Right Ear: Tympanic membrane and external ear normal.  Left Ear: Tympanic membrane and external ear normal.  Mouth/Throat: Oropharynx is clear and moist. No oral lesions. No trismus in the jaw. No uvula swelling. No oropharyngeal exudate, posterior oropharyngeal edema, posterior oropharyngeal erythema or tonsillar abscesses.  Eyes: Conjunctivae and EOM are normal. Pupils are equal, round, and reactive to light. Right eye exhibits no discharge. Left eye exhibits no discharge. No scleral icterus.  Neck: Normal range of motion. Neck supple. No tracheal deviation present.  No meningismus.   Cardiovascular: Normal rate, regular rhythm, normal heart sounds and intact distal pulses.  Exam reveals no gallop and no friction rub.   No murmur heard. Pulmonary/Chest: Effort normal and breath sounds normal. No respiratory distress. She has no wheezes. She has no rales. She exhibits no tenderness.  Abdominal: Soft. Bowel sounds are normal. She exhibits no distension and no mass. There is no tenderness. There is no rebound and no guarding.  No CVA tenderness.  Musculoskeletal: Normal range of motion. She exhibits no edema or tenderness.  Lymphadenopathy:    She has no cervical adenopathy.  Neurological: She is alert and oriented to person, place, and time. No cranial nerve deficit.  Strength 5/5 throughout. No sensory deficits.  No midline spinal tenderness.   Skin: Skin is warm. No rash noted. She is diaphoretic. No erythema. No pallor.  Psychiatric: She has a normal mood and affect. Her behavior is normal.  Nursing note and vitals reviewed.   ED Course  Procedures (including critical  care time) Labs Review Labs Reviewed  URINALYSIS, ROUTINE W REFLEX MICROSCOPIC (NOT AT [facility]) - Abnormal; Notable for the following:    Color, Urine AMBER (*)    APPearance CLOUDY (*)    Hgb urine dipstick TRACE (*)    Ketones, ur 15 (*)    Protein, ur 30 (*)    Urobilinogen, UA 4.0 (*)    Nitrite POSITIVE (*)    Leukocytes, UA LARGE (*)    All other components within normal limits  BASIC METABOLIC PANEL - Abnormal; Notable for the following:    Glucose, Bld 121 (*)    All other components within normal limits  CBC WITH DIFFERENTIAL/PLATELET - Abnormal; Notable for the following:    WBC 10.6 (*)    Monocytes Absolute 1.5 (*)    All other components within normal limits  URINE MICROSCOPIC-ADD ON - Abnormal; Notable for the following:    Squamous Epithelial / LPF FEW (*)    Bacteria, UA MANY (*)    Casts HYALINE CASTS (*)    All other components within normal limits  RAPID STREP SCREEN (NOT AT [facility])  CULTURE, BLOOD (ROUTINE X 2)  CULTURE, BLOOD (ROUTINE X 2)  URINE CULTURE  CULTURE, GROUP A STREP  MONONUCLEOSIS SCREEN  I-STAT CG4 LACTIC ACID, ED    Imaging Review No results found. I have personally reviewed and evaluated these images and lab results as part of my medical decision-making.   EKG Interpretation   Date/Time:  Sunday April 21 2015 18:17:45 EDT Ventricular Rate:  93 PR Interval:  142 QRS Duration: 84 QT Interval:  326 QTC Calculation: 405 R Axis:   40 Text Interpretation:  Normal sinus rhythm Normal ECG No acute changes  Confirmed by Rhunette CroftNANAVATI, MD, Janey GentaANKIT [phone] on 04/21/2015 6:17:55 PM      MDM   Final diagnoses:  Pyelonephritis    44 y.o F with no significant pmhx presents with fever, generalized body aches x 1week. Fever 102 in ED. Pt is diaphoretic and dripping in sweat upon exam. Pt states she has been sweating like this for 1 week. No hx of cancer. No cough, sore throat, dysuria, SOB, swollen joint that would explain a source of infection. No  hx IV drug use. No open wounds or sores. CXR negative for infection. WBC mildly elevated to 10.6. Mono negative. Rapid strep negative. Lactic acid wnl. Blood and urine cultures collected. Pt given 2L fluids.   UA reveals grossly infected urine. Positive nitrites. Large leukocytes. Many bacteria. Pyuria. Pt given 1g ceftriaxone IV. Given acetaminophen.   Pt with complete symptomatic improvement. Pt no longer sweating. Temperature now 98. Pt states her body aches have resolved. Suspect infection source from pyelonephritis. Does not meet SIRS criteria at this time. Will send home with 14 days cipro for pyelonephritis. Pt is to follow up this week with PCP for re-evaluation. Patient was discussed with and seen by Dr. Rhunette CroftNanavati who agrees with the treatment plan. Pt now stable for discharge. Return precautions outlined in patient discharge instructions.  Lester Kinsman Aulander, PA-C 04/27/15 1156  Derwood Kaplan, MD 05/10/15 [phone]

## 2015-12-15 ENCOUNTER — Encounter (HOSPITAL_BASED_OUTPATIENT_CLINIC_OR_DEPARTMENT_OTHER): Payer: Self-pay | Admitting: Emergency Medicine

## 2015-12-15 ENCOUNTER — Emergency Department (HOSPITAL_BASED_OUTPATIENT_CLINIC_OR_DEPARTMENT_OTHER)
Admission: EM | Admit: 2015-12-15 | Discharge: 2015-12-15 | Disposition: A | Discharge status: Home / Self Care | Payer: Medicaid Other | Attending: Emergency Medicine | Admitting: Emergency Medicine

## 2015-12-15 DIAGNOSIS — R0981 Nasal congestion: Secondary | ICD-10-CM | POA: Diagnosis present

## 2015-12-15 DIAGNOSIS — J069 Acute upper respiratory infection, unspecified: Secondary | ICD-10-CM | POA: Diagnosis not present

## 2015-12-15 MED ORDER — FLUTICASONE PROPIONATE 50 MCG/ACT NA SUSP: 1.0000 | Freq: Every day | NASAL | Status: DC

## 2015-12-15 MED ORDER — BENZONATATE 100 MG PO CAPS: 100.0000 mg | ORAL_CAPSULE | Freq: Three times a day (TID) | ORAL | Status: DC

## 2015-12-15 NOTE — ED Provider Notes (Signed)
CSN: [id number]     Arrival date & time 12/15/15  1545 History   First MD Initiated Contact with Patient 12/15/15 1601     Chief Complaint  Patient presents with  . Nasal Congestion    (Consider location/radiation/quality/duration/timing/severity/associated sxs/prior Treatment) The history is provided by the patient and medical records. No language interpreter was used.     [name] is a 45 y.o. female  who presents to the Emergency Department complaining of persistent nasal congestion and productive cough x 1 week. Cough worse at night. Hall's cough drops taken with little relief. Denies fever, chest pain, shortness of breath, sore throat. Not a smoker. No known sick contacts.   Past Medical History  Diagnosis Date  . Cellulitis and abscess    Past Surgical History  Procedure Laterality Date  . Abdominal hysterectomy    . Cholecystectomy    . Knee surgery     History reviewed. No pertinent family history. Social History  Substance Use Topics  . Smoking status: Never Smoker   . Smokeless tobacco: None  . Alcohol Use: No   OB History    No data available     Review of Systems  Constitutional: Negative for fever.  HENT: Positive for congestion. Negative for sore throat.   Respiratory: Positive for cough. Negative for shortness of breath.   Cardiovascular: Negative for chest pain.      Allergies  Sulfa antibiotics  Home Medications   Prior to Admission medications   Medication Sig Start Date End Date Taking? Authorizing Provider  ciprofloxacin (CIPRO) 500 MG tablet Take 1 tablet (500 mg total) by mouth 2 (two) times daily. 04/21/15   Samantha Tripp Dowless, PA-C  hydrocortisone cream 1 % Apply 1 application topically 2 (two) times daily.    Historical Provider, MD   BP 138/84 mmHg  Pulse 102  Temp(Src) 98.7 F (37.1 C) (Oral)  Resp 18  Ht 5\' 5"  (1.651 m)  Wt 127.007 kg  BMI 46.59 kg/m2  SpO2 100% Physical Exam  Constitutional: She is oriented to  person, place, and time. She appears well-developed and well-nourished. No distress.  HENT:  Head: Normocephalic and atraumatic.  OP with erythema, no exudates or tonsillar hypertrophy. + nasal congestion with mucosal edema.   Neck: Normal range of motion. Neck supple.  No meningeal signs.   Cardiovascular: Normal heart sounds.   Mildly tachycardic but regular.   Pulmonary/Chest: Effort normal.  Lungs are clear to auscultation bilaterally - no w/r/r  Musculoskeletal: Normal range of motion.  Neurological: She is alert and oriented to person, place, and time.  Skin: Skin is warm and dry. She is not diaphoretic.  Nursing note and vitals reviewed.   ED Course  Procedures (including critical care time) Labs Review Labs Reviewed - No data to display  Imaging Review No results found. I have personally reviewed and evaluated these images and lab results as part of my medical decision-making.   EKG Interpretation None      MDM   Final diagnoses:  None   [name] is afebrile, non-toxic appearing with a clear lung exam. Mild rhinorrhea and OP with erythema, no exudates. Likely viral URI. Patient is agreeable to symptomatic treatment with close follow up with PCP as needed but spoke at length about emergent, changing, or worsening of symptoms that should prompt return to ER. Increase hydration. Patient voices understanding and is agreeable to plan.   [facility] Center[facility]ime Pilcher Kylle Lall, PA-C 12/15/15 1617  Linwood DibblesJon Knapp, MD 12/15/15  1629 

## 2015-12-15 NOTE — ED Notes (Signed)
Patient states that she is having green drainage from her nose and chest congestion x 1 week

## 2015-12-15 NOTE — Discharge Instructions (Signed)
1. Medications: flonase and mucinex for nasal congestion, tessalon for cough, continue usual home medications °2. Treatment: rest, drink plenty of fluids °3. Follow Up: Please follow up with your primary doctor for discussion of your diagnoses and further evaluation after today's visit if symptoms persist longer than 10-14 days; Return to the ER for high fevers, difficulty breathing or other concerning symptoms °

## 2016-05-28 ENCOUNTER — Emergency Department (HOSPITAL_BASED_OUTPATIENT_CLINIC_OR_DEPARTMENT_OTHER)
Admission: EM | Admit: 2016-05-28 | Discharge: 2016-05-28 | Disposition: A | Discharge status: Home / Self Care | Payer: Medicaid Other | Attending: Physician Assistant | Admitting: Physician Assistant

## 2016-05-28 ENCOUNTER — Encounter (HOSPITAL_BASED_OUTPATIENT_CLINIC_OR_DEPARTMENT_OTHER): Payer: Self-pay | Admitting: *Deleted

## 2016-05-28 DIAGNOSIS — B9689 Other specified bacterial agents as the cause of diseases classified elsewhere: Secondary | ICD-10-CM

## 2016-05-28 DIAGNOSIS — N760 Acute vaginitis: Secondary | ICD-10-CM

## 2016-05-28 DIAGNOSIS — B373 Candidiasis of vulva and vagina: Secondary | ICD-10-CM | POA: Diagnosis not present

## 2016-05-28 DIAGNOSIS — B3731 Acute candidiasis of vulva and vagina: Secondary | ICD-10-CM

## 2016-05-28 DIAGNOSIS — N898 Other specified noninflammatory disorders of vagina: Secondary | ICD-10-CM | POA: Diagnosis present

## 2016-05-28 LAB — WET PREP, GENITAL
SPERM: NONE SEEN
TRICH WET PREP: NONE SEEN

## 2016-05-28 LAB — URINALYSIS, ROUTINE W REFLEX MICROSCOPIC
Bilirubin Urine: NEGATIVE
Glucose, UA: NEGATIVE mg/dL
Hgb urine dipstick: NEGATIVE
Ketones, ur: NEGATIVE mg/dL
NITRITE: NEGATIVE
PH: 5.5 (ref 5.0–8.0)
Protein, ur: NEGATIVE mg/dL
SPECIFIC GRAVITY, URINE: 1.036 — AB (ref 1.005–1.030)

## 2016-05-28 LAB — URINE MICROSCOPIC-ADD ON

## 2016-05-28 MED ORDER — FLUCONAZOLE 50 MG PO TABS
150.0000 mg | ORAL_TABLET | Freq: Once | ORAL | Status: AC
  Administered 2016-05-28: 150 mg via ORAL
  Filled 2016-05-28: qty 1

## 2016-05-28 MED ORDER — METRONIDAZOLE 500 MG PO TABS: 500.0000 mg | ORAL_TABLET | Freq: Two times a day (BID) | ORAL | 0 refills | Status: AC

## 2016-05-28 NOTE — Discharge Instructions (Signed)
Fevers seen today for vaginal itching. Your lab work showed that you have signs of yeast and bacterial vaginosis. These are not STDs. We have given you a medicine for the yeast and a prescription for the bacterial vaginosis. Please take these as prescribed. Please come back if your symptoms don't improve over the next couple days or you develop a fever. Take care!

## 2016-05-28 NOTE — ED Provider Notes (Signed)
[facility] Provider Note   CSN: [id number] Arrival date & time: 05/28/16  0808   History   Chief Complaint Chief Complaint  Patient presents with  . Vaginal Itching    HPI   [name] is a 45 y.o. female who presents with vaginal itching 2 days. Patient states that she had a yeast infection last week which she took a three-day course of Monistat 4. She notes that her vagina has just been itchy. She thinks this may be because she had changed laundry detergents that she has not sure. No new soaps, no douching. Patient states that she has had the same sexual partner, does not use condoms. She has had a hysterectomy in the past and does not have any periods. Denies any vaginal discharge or vaginal bleeding. Denies any fevers, chills, pelvic pain, dysuria.  Past Medical History:  Diagnosis Date  . Cellulitis and abscess     There are no active problems to display for this patient.   Past Surgical History:  Procedure Laterality Date  . ABDOMINAL HYSTERECTOMY    . CHOLECYSTECTOMY    . KNEE SURGERY      OB History    No data available       Home Medications    Prior to Admission medications   Medication Sig Start Date End Date Taking? Authorizing Provider  fluticasone (FLONASE) 50 MCG/ACT nasal spray Place 1 spray into both nostrils daily. 12/15/15   Chase PicketJaime Pilcher Ward, PA-C  hydrocortisone cream 1 % Apply 1 application topically 2 (two) times daily.    Historical Provider, MD  metroNIDAZOLE (FLAGYL) 500 MG tablet Take 1 tablet (500 mg total) by mouth 2 (two) times daily. 05/28/16 06/04/16  Beaulah Dinninghristina M Kerilyn Cortner, MD    Family History No family history on file.  Social History Social History  Substance Use Topics  . Smoking status: Never Smoker  . Smokeless tobacco: Not on file  . Alcohol use No     Allergies   Sulfa antibiotics   Review of Systems Review of Systems  10 Systems reviewed and are negative for acute change except as noted in the  HPI.    Physical Exam Updated Vital Signs BP 136/78 (BP Location: Left Arm)   Pulse 79   Temp 98 F (36.7 C) (Oral)   Resp 18   Ht 5\' 5"  (1.651 m)   Wt 127 kg   SpO2 97%   BMI 46.59 kg/m   Physical Exam  Constitutional: She is oriented to person, place, and time. She appears well-developed and well-nourished.  HENT:  Head: Normocephalic.  Right Ear: External ear normal.  Left Ear: External ear normal.  Nose: Nose normal.  Mouth/Throat: Oropharynx is clear and moist.  Eyes: Conjunctivae are normal. Pupils are equal, round, and reactive to light.  Neck: Normal range of motion.  Cardiovascular: Normal rate and regular rhythm.   Pulmonary/Chest: Effort normal. No respiratory distress.  Abdominal: Soft. She exhibits no distension. There is no tenderness.  Genitourinary:  Genitourinary Comments: External genitalia within normal limits. Vaginal mucosa pink, moist, normal rugae. Cervix not observed. No bleeding noted on speculum exam but there is white clumpy discharge in the vaginal canal. Bimanual exam unremarkable. No adnexal masses bilaterally.     Musculoskeletal: Normal range of motion. She exhibits no edema or tenderness.  Neurological: She is alert and oriented to person, place, and time. She exhibits normal muscle tone.  Skin: Skin is warm and dry. Capillary refill takes less than 2  seconds.  Psychiatric: She has a normal mood and affect. Her behavior is normal. Judgment and thought content normal.     ED Treatments / Results  Labs (all labs ordered are listed, but only abnormal results are displayed) Labs Reviewed  WET PREP, GENITAL - Abnormal; Notable for the following:       Result Value   Yeast Wet Prep HPF POC PRESENT (*)    Clue Cells Wet Prep HPF POC PRESENT (*)    WBC, Wet Prep HPF POC MANY (*)    All other components within normal limits  URINALYSIS, ROUTINE W REFLEX MICROSCOPIC (NOT AT [facility]) - Abnormal; Notable for the following:    APPearance CLOUDY (*)     Specific Gravity, Urine 1.036 (*)    Leukocytes, UA SMALL (*)    All other components within normal limits  URINE MICROSCOPIC-ADD ON - Abnormal; Notable for the following:    Squamous Epithelial / LPF 0-5 (*)    Bacteria, UA FEW (*)    All other components within normal limits  GC/CHLAMYDIA PROBE AMP ([place]) NOT AT [facility]    EKG  EKG Interpretation None       Radiology No results found.  Procedures Procedures (including critical care time)  Medications Ordered in ED Medications  fluconazole (DIFLUCAN) tablet 150 mg (150 mg Oral Given 05/28/16 0857)     Initial Impression / Assessment and Plan / ED Course  I have reviewed the triage vital signs and the nursing notes.  Pertinent labs & imaging results that were available during my care of the patient were reviewed by me and considered in my medical decision making (see chart for details).  Clinical Course    Patient is a 45 year old female s/p hysterectomy who presents with a 2 day history of vaginal itching.   Vital signs were stable patient afebrile. Pelvic exam showing white clumpy vaginal discharge. Wet prep and GC chlamydia collected. Pregnancy test not collected as patient is status post hysterectomy.   Wet prep showing yeast, many white blood cells, and clue cells. Patient received 150 mg of Diflucan while here. She was given a prescription for Flagyl for bacterial vaginosis. Patient stable for discharge.   Final Clinical Impressions(s) / ED Diagnoses   Final diagnoses:  Bacterial vaginosis  Vaginal yeast infection    New Prescriptions New Prescriptions   METRONIDAZOLE (FLAGYL) 500 MG TABLET    Take 1 tablet (500 mg total) by mouth 2 (two) times daily.     Beaulah Dinninghristina M Francoise Chojnowski, MD 05/29/16 [phone]    Abelino Derrickourteney Lyn Mackuen, MD 05/29/16 1326

## 2016-05-28 NOTE — ED Triage Notes (Signed)
Pt c/o vaginal itching and irritation x 3 days

## 2016-05-29 LAB — GC/CHLAMYDIA PROBE AMP (~~LOC~~) NOT AT ARMC
CHLAMYDIA, DNA PROBE: NEGATIVE
Neisseria Gonorrhea: NEGATIVE

## 2016-10-24 IMAGING — DX DG CHEST 2V
2 series · 2 of 2 positions shown · non-contrast
Comparison: None.

CLINICAL DATA: Fever.  Back pain.

EXAM:
CHEST  2 VIEW

[chest pa]
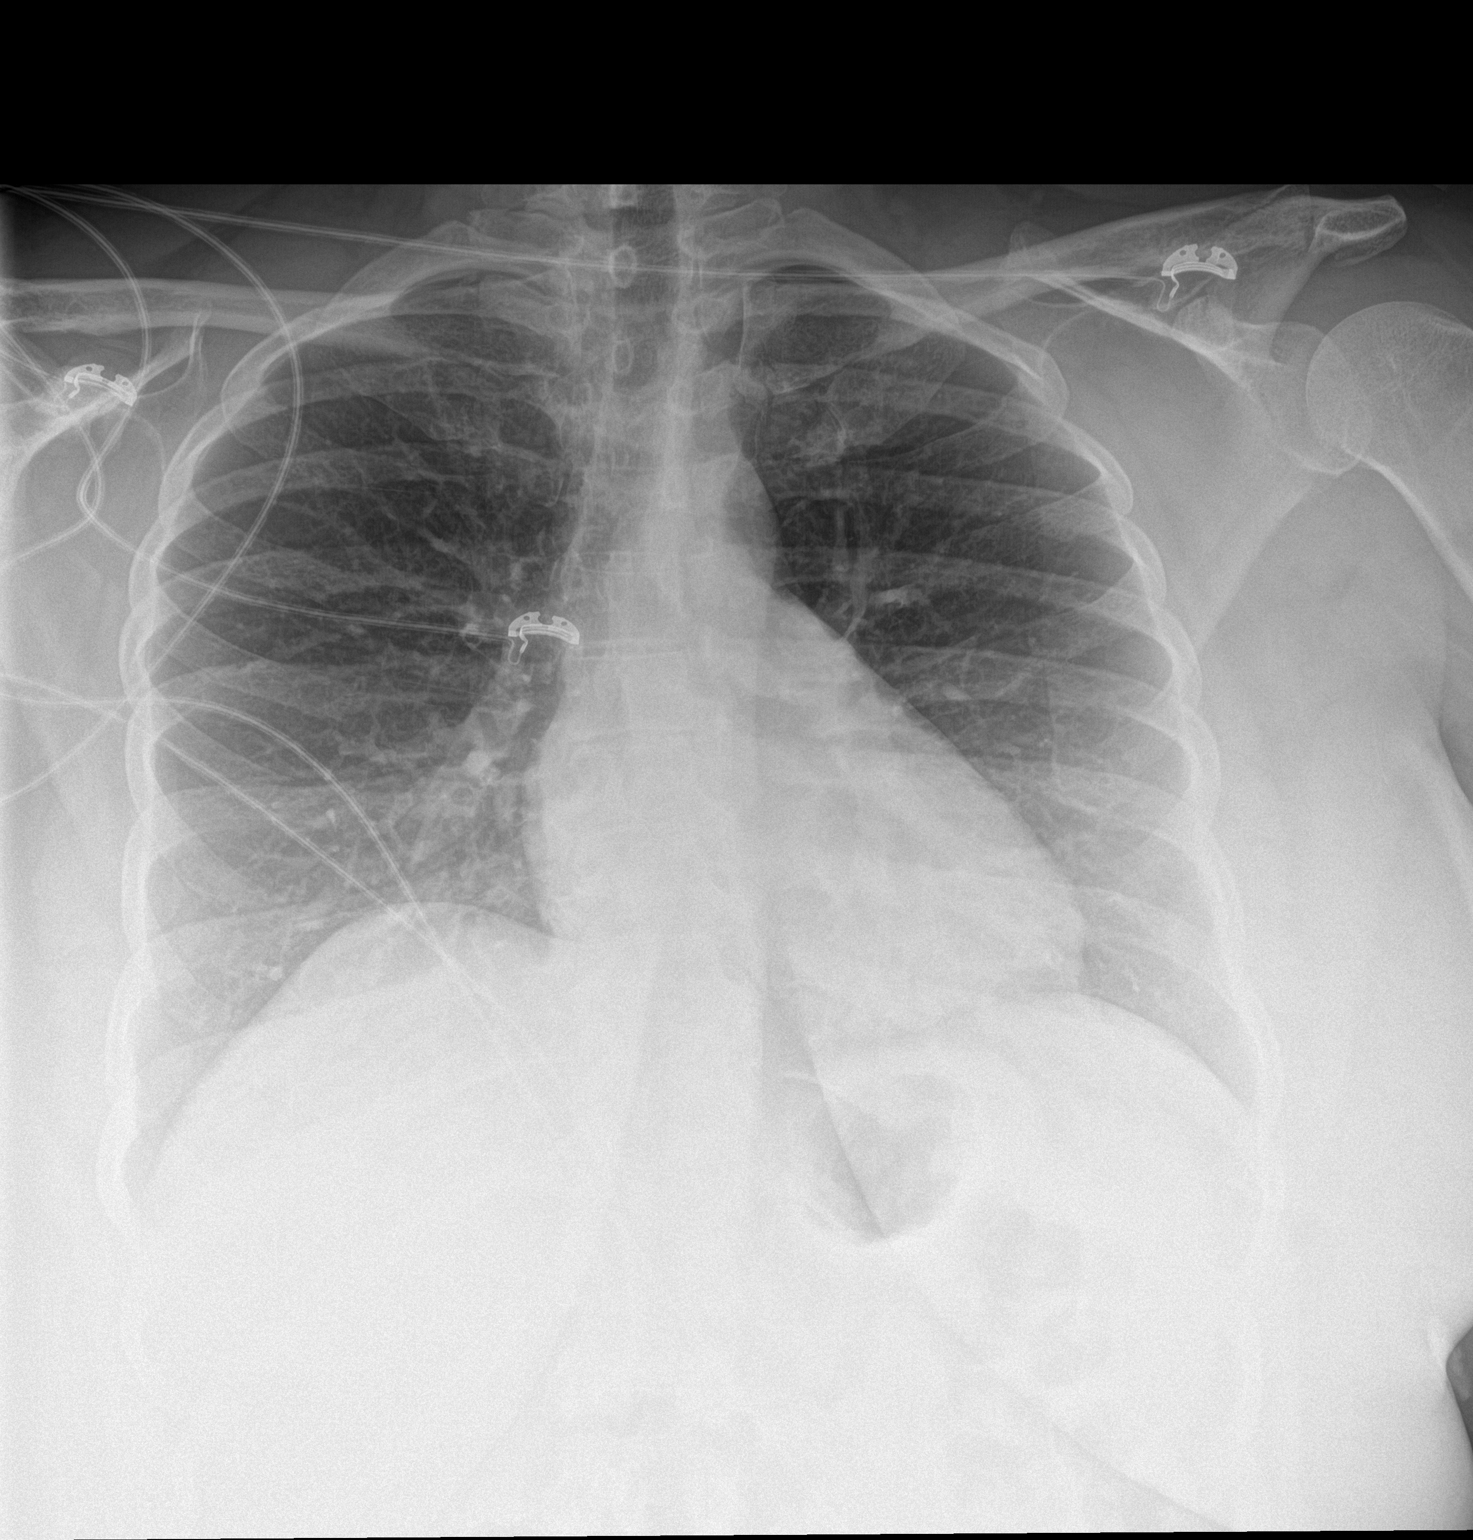

[chest lat]
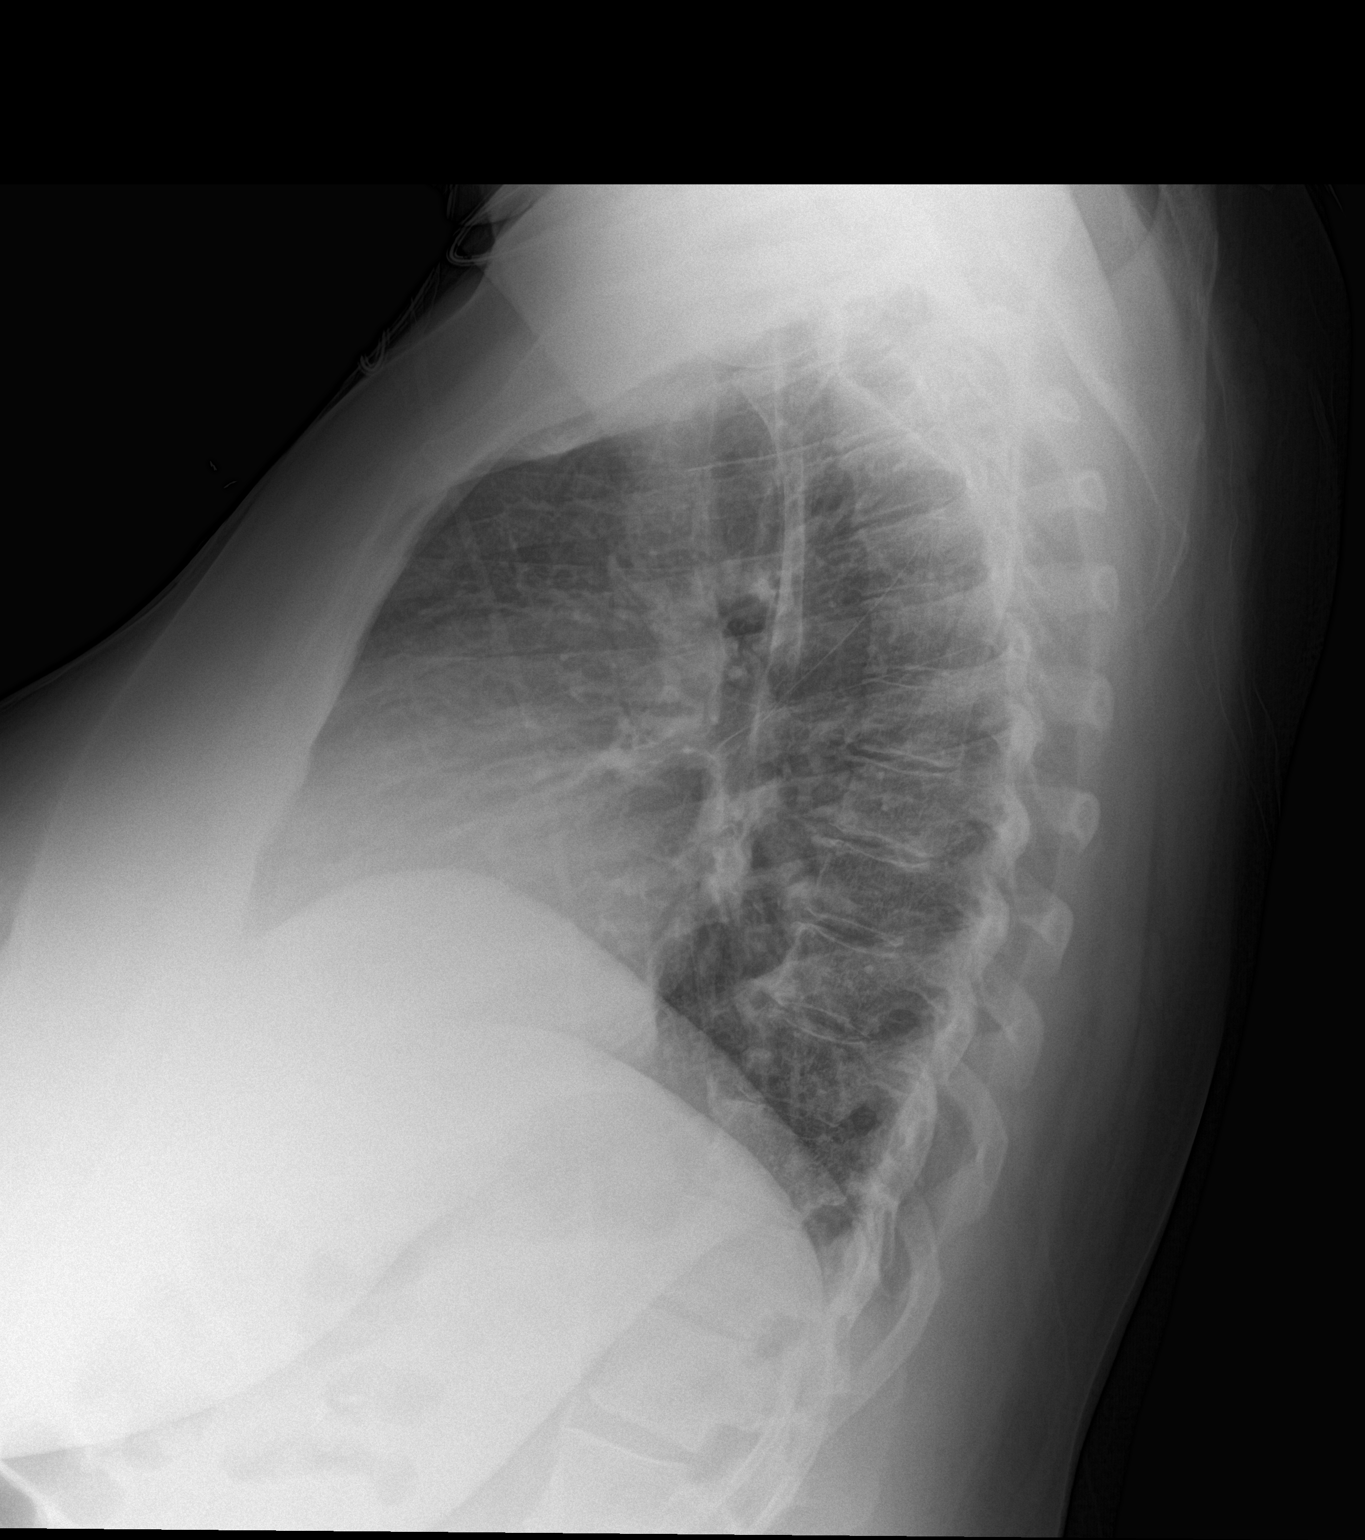

[2 of 2 positions shown; findings below may reference images not displayed]

FINDINGS: The heart size and mediastinal contours are within normal limits.
Both lungs are clear. The visualized skeletal structures are
unremarkable.
IMPRESSION: Normal chest.

## 2019-11-23 DIAGNOSIS — Z03818 Encounter for observation for suspected exposure to other biological agents ruled out: Secondary | ICD-10-CM | POA: Diagnosis not present

## 2019-11-23 DIAGNOSIS — Z20828 Contact with and (suspected) exposure to other viral communicable diseases: Secondary | ICD-10-CM | POA: Diagnosis not present

## 2020-03-06 DIAGNOSIS — Z1231 Encounter for screening mammogram for malignant neoplasm of breast: Secondary | ICD-10-CM | POA: Diagnosis not present

## 2020-04-08 DIAGNOSIS — Z113 Encounter for screening for infections with a predominantly sexual mode of transmission: Secondary | ICD-10-CM | POA: Diagnosis not present

## 2020-04-08 DIAGNOSIS — Z01419 Encounter for gynecological examination (general) (routine) without abnormal findings: Secondary | ICD-10-CM | POA: Diagnosis not present

## 2020-04-08 DIAGNOSIS — Z1329 Encounter for screening for other suspected endocrine disorder: Secondary | ICD-10-CM | POA: Diagnosis not present

## 2020-04-08 DIAGNOSIS — Z130 Encounter for screening for diseases of the blood and blood-forming organs and certain disorders involving the immune mechanism: Secondary | ICD-10-CM | POA: Diagnosis not present

## 2020-04-08 DIAGNOSIS — Z13228 Encounter for screening for other metabolic disorders: Secondary | ICD-10-CM | POA: Diagnosis not present

## 2020-05-15 DIAGNOSIS — R059 Cough, unspecified: Secondary | ICD-10-CM | POA: Diagnosis not present

## 2020-05-15 DIAGNOSIS — R7303 Prediabetes: Secondary | ICD-10-CM | POA: Diagnosis not present

## 2020-05-30 ENCOUNTER — Inpatient Hospital Stay (HOSPITAL_BASED_OUTPATIENT_CLINIC_OR_DEPARTMENT_OTHER)
Admission: EM | Admit: 2020-05-30 | Discharge: 2020-06-04 | DRG: 175 | Disposition: A | Discharge status: Home / Self Care | Payer: Federal, State, Local not specified - PPO | Attending: Internal Medicine | Admitting: Internal Medicine

## 2020-05-30 ENCOUNTER — Emergency Department (HOSPITAL_BASED_OUTPATIENT_CLINIC_OR_DEPARTMENT_OTHER): Payer: Federal, State, Local not specified - PPO

## 2020-05-30 ENCOUNTER — Encounter (HOSPITAL_BASED_OUTPATIENT_CLINIC_OR_DEPARTMENT_OTHER): Payer: Self-pay

## 2020-05-30 DIAGNOSIS — Z20822 Contact with and (suspected) exposure to covid-19: Secondary | ICD-10-CM | POA: Diagnosis present

## 2020-05-30 DIAGNOSIS — Z9049 Acquired absence of other specified parts of digestive tract: Secondary | ICD-10-CM | POA: Diagnosis not present

## 2020-05-30 DIAGNOSIS — I2609 Other pulmonary embolism with acute cor pulmonale: Secondary | ICD-10-CM | POA: Diagnosis not present

## 2020-05-30 DIAGNOSIS — D6859 Other primary thrombophilia: Secondary | ICD-10-CM | POA: Diagnosis not present

## 2020-05-30 DIAGNOSIS — I10 Essential (primary) hypertension: Secondary | ICD-10-CM | POA: Diagnosis not present

## 2020-05-30 DIAGNOSIS — Z79899 Other long term (current) drug therapy: Secondary | ICD-10-CM

## 2020-05-30 DIAGNOSIS — I2699 Other pulmonary embolism without acute cor pulmonale: Secondary | ICD-10-CM | POA: Diagnosis present

## 2020-05-30 DIAGNOSIS — Z6841 Body Mass Index (BMI) 40.0 and over, adult: Secondary | ICD-10-CM | POA: Diagnosis not present

## 2020-05-30 DIAGNOSIS — Z86711 Personal history of pulmonary embolism: Secondary | ICD-10-CM

## 2020-05-30 DIAGNOSIS — Z882 Allergy status to sulfonamides status: Secondary | ICD-10-CM | POA: Diagnosis not present

## 2020-05-30 DIAGNOSIS — E876 Hypokalemia: Secondary | ICD-10-CM | POA: Diagnosis present

## 2020-05-30 DIAGNOSIS — R778 Other specified abnormalities of plasma proteins: Secondary | ICD-10-CM | POA: Diagnosis present

## 2020-05-30 DIAGNOSIS — I2729 Other secondary pulmonary hypertension: Secondary | ICD-10-CM | POA: Diagnosis not present

## 2020-05-30 DIAGNOSIS — I2692 Saddle embolus of pulmonary artery without acute cor pulmonale: Principal | ICD-10-CM | POA: Diagnosis present

## 2020-05-30 DIAGNOSIS — I50810 Right heart failure, unspecified: Secondary | ICD-10-CM | POA: Diagnosis not present

## 2020-05-30 DIAGNOSIS — J9601 Acute respiratory failure with hypoxia: Secondary | ICD-10-CM | POA: Diagnosis present

## 2020-05-30 DIAGNOSIS — Z90710 Acquired absence of both cervix and uterus: Secondary | ICD-10-CM

## 2020-05-30 DIAGNOSIS — R0902 Hypoxemia: Secondary | ICD-10-CM | POA: Diagnosis not present

## 2020-05-30 DIAGNOSIS — I110 Hypertensive heart disease with heart failure: Secondary | ICD-10-CM | POA: Diagnosis not present

## 2020-05-30 DIAGNOSIS — J302 Other seasonal allergic rhinitis: Secondary | ICD-10-CM | POA: Diagnosis present

## 2020-05-30 DIAGNOSIS — I2602 Saddle embolus of pulmonary artery with acute cor pulmonale: Secondary | ICD-10-CM | POA: Diagnosis not present

## 2020-05-30 DIAGNOSIS — R000 Tachycardia, unspecified: Secondary | ICD-10-CM | POA: Diagnosis not present

## 2020-05-30 DIAGNOSIS — R0602 Shortness of breath: Secondary | ICD-10-CM | POA: Diagnosis not present

## 2020-05-30 DIAGNOSIS — Z0189 Encounter for other specified special examinations: Secondary | ICD-10-CM

## 2020-05-30 LAB — URINALYSIS, MICROSCOPIC (REFLEX)

## 2020-05-30 LAB — CBC
HCT: 49.4 % — ABNORMAL HIGH (ref 36.0–46.0)
Hemoglobin: 16 g/dL — ABNORMAL HIGH (ref 12.0–15.0)
MCH: 29.3 pg (ref 26.0–34.0)
MCHC: 32.4 g/dL (ref 30.0–36.0)
MCV: 90.3 fL (ref 80.0–100.0)
Platelets: 193 10*3/uL (ref 150–400)
RBC: 5.47 MIL/uL — ABNORMAL HIGH (ref 3.87–5.11)
RDW: 14.6 % (ref 11.5–15.5)
WBC: 7 10*3/uL (ref 4.0–10.5)
nRBC: 0 % (ref 0.0–0.2)

## 2020-05-30 LAB — PREGNANCY, URINE: Preg Test, Ur: NEGATIVE

## 2020-05-30 LAB — COMPREHENSIVE METABOLIC PANEL
ALT: 96 U/L — ABNORMAL HIGH (ref 0–44)
AST: 64 U/L — ABNORMAL HIGH (ref 15–41)
Albumin: 3.6 g/dL (ref 3.5–5.0)
Alkaline Phosphatase: 97 U/L (ref 38–126)
Anion gap: 10 (ref 5–15)
BUN: 11 mg/dL (ref 6–20)
CO2: 22 mmol/L (ref 22–32)
Calcium: 8.9 mg/dL (ref 8.9–10.3)
Chloride: 106 mmol/L (ref 98–111)
Creatinine, Ser: 1.17 mg/dL — ABNORMAL HIGH (ref 0.44–1.00)
GFR, Estimated: 58 mL/min — ABNORMAL LOW (ref >60)
Glucose, Bld: 143 mg/dL — ABNORMAL HIGH (ref 70–99)
Potassium: 4.1 mmol/L (ref 3.5–5.1)
Sodium: 138 mmol/L (ref 135–145)
Total Bilirubin: 1.2 mg/dL (ref 0.3–1.2)
Total Protein: 7.3 g/dL (ref 6.5–8.1)

## 2020-05-30 LAB — CBC WITH DIFFERENTIAL/PLATELET
Abs Immature Granulocytes: 0.01 10*3/uL (ref 0.00–0.07)
Basophils Absolute: 0 10*3/uL (ref 0.0–0.1)
Basophils Relative: 1 %
Eosinophils Absolute: 0 10*3/uL (ref 0.0–0.5)
Eosinophils Relative: 0 %
HCT: 44.1 % (ref 36.0–46.0)
Hemoglobin: 14.5 g/dL (ref 12.0–15.0)
Immature Granulocytes: 0 %
Lymphocytes Relative: 35 %
Lymphs Abs: 2.2 10*3/uL (ref 0.7–4.0)
MCH: 29.5 pg (ref 26.0–34.0)
MCHC: 32.9 g/dL (ref 30.0–36.0)
MCV: 89.8 fL (ref 80.0–100.0)
Monocytes Absolute: 0.7 10*3/uL (ref 0.1–1.0)
Monocytes Relative: 10 %
Neutro Abs: 3.4 10*3/uL (ref 1.7–7.7)
Neutrophils Relative %: 54 %
Platelets: 216 10*3/uL (ref 150–400)
RBC: 4.91 MIL/uL (ref 3.87–5.11)
RDW: 14.6 % (ref 11.5–15.5)
WBC: 6.4 10*3/uL (ref 4.0–10.5)
nRBC: 0 % (ref 0.0–0.2)

## 2020-05-30 LAB — RESP PANEL BY RT-PCR (FLU A&B, COVID) ARPGX2
Influenza A by PCR: NEGATIVE
Influenza B by PCR: NEGATIVE
SARS Coronavirus 2 by RT PCR: NEGATIVE

## 2020-05-30 LAB — URINALYSIS, ROUTINE W REFLEX MICROSCOPIC
Bilirubin Urine: NEGATIVE
Glucose, UA: NEGATIVE mg/dL
Ketones, ur: NEGATIVE mg/dL
Leukocytes,Ua: NEGATIVE
Nitrite: POSITIVE — AB
Protein, ur: NEGATIVE mg/dL
Specific Gravity, Urine: 1.025 (ref 1.005–1.030)
pH: 6 (ref 5.0–8.0)

## 2020-05-30 LAB — TROPONIN I (HIGH SENSITIVITY)
Troponin I (High Sensitivity): 1550 ng/L (ref <18)
Troponin I (High Sensitivity): 1266 ng/L (ref <18)

## 2020-05-30 LAB — BRAIN NATRIURETIC PEPTIDE: B Natriuretic Peptide: 466.8 pg/mL — ABNORMAL HIGH (ref 0.0–100.0)

## 2020-05-30 LAB — MRSA PCR SCREENING: MRSA by PCR: NEGATIVE

## 2020-05-30 LAB — D-DIMER, QUANTITATIVE: D-Dimer, Quant: 12.5 ug/mL-FEU — ABNORMAL HIGH (ref 0.00–0.50)

## 2020-05-30 MED ORDER — ACETAMINOPHEN 650 MG RE SUPP: 650.0000 mg | Freq: Four times a day (QID) | RECTAL | Status: DC | PRN

## 2020-05-30 MED ORDER — ORAL CARE MOUTH RINSE
15.0000 mL | Freq: Two times a day (BID) | OROMUCOSAL | Status: DC
  Administered 2020-05-30 – 2020-06-04 (×9): 15 mL via OROMUCOSAL

## 2020-05-30 MED ORDER — ONDANSETRON HCL 4 MG PO TABS: 4.0000 mg | ORAL_TABLET | Freq: Four times a day (QID) | ORAL | Status: DC | PRN

## 2020-05-30 MED ORDER — FUROSEMIDE 10 MG/ML IJ SOLN
20.0000 mg | Freq: Once | INTRAMUSCULAR | Status: AC
  Administered 2020-05-30: 20 mg via INTRAVENOUS
  Filled 2020-05-30: qty 2

## 2020-05-30 MED ORDER — ASPIRIN 81 MG PO CHEW
324.0000 mg | CHEWABLE_TABLET | Freq: Once | ORAL | Status: AC
  Administered 2020-05-30: 324 mg via ORAL
  Filled 2020-05-30: qty 4

## 2020-05-30 MED ORDER — ONDANSETRON HCL 4 MG/2ML IJ SOLN: 4.0000 mg | Freq: Four times a day (QID) | INTRAMUSCULAR | Status: DC | PRN

## 2020-05-30 MED ORDER — IOHEXOL 350 MG/ML SOLN
100.0000 mL | Freq: Once | INTRAVENOUS | Status: AC | PRN
  Administered 2020-05-30: 100 mL via INTRAVENOUS

## 2020-05-30 MED ORDER — ACETAMINOPHEN 325 MG PO TABS: 650.0000 mg | ORAL_TABLET | Freq: Four times a day (QID) | ORAL | Status: DC | PRN

## 2020-05-30 MED ORDER — HEPARIN BOLUS VIA INFUSION
6300.0000 [IU] | Freq: Once | INTRAVENOUS | Status: AC
  Administered 2020-05-30: 6300 [IU] via INTRAVENOUS

## 2020-05-30 MED ORDER — HEPARIN (PORCINE) 25000 UT/250ML-% IV SOLN
1550.0000 [IU]/h | INTRAVENOUS | Status: DC
  Administered 2020-05-30: 1550 [IU]/h via INTRAVENOUS
  Filled 2020-05-30: qty 250

## 2020-05-30 MED ORDER — CHLORHEXIDINE GLUCONATE CLOTH 2 % EX PADS
6.0000 | MEDICATED_PAD | Freq: Every day | CUTANEOUS | Status: DC
  Administered 2020-05-31 – 2020-06-04 (×5): 6 via TOPICAL

## 2020-05-30 MED ORDER — SODIUM CHLORIDE 0.9% FLUSH
3.0000 mL | Freq: Two times a day (BID) | INTRAVENOUS | Status: DC
  Administered 2020-05-30 – 2020-06-04 (×7): 3 mL via INTRAVENOUS

## 2020-05-30 MED ORDER — LABETALOL HCL 5 MG/ML IV SOLN
10.0000 mg | INTRAVENOUS | Status: AC | PRN
  Administered 2020-05-30 – 2020-05-31 (×2): 10 mg via INTRAVENOUS
  Filled 2020-05-30 (×2): qty 4

## 2020-05-30 NOTE — Progress Notes (Signed)
ANTICOAGULATION CONSULT NOTE  Pharmacy Consult for Heparin  Indication: pulmonary embolus  Allergies  Allergen Reactions  . Sulfa Antibiotics Rash    Patient Measurements: Height: 5\' 5"  (165.1 cm) Weight: 136.1 kg (300 lb) IBW/kg (Calculated) : 57 Heparin Dosing Weight: 90.7 kg  Vital Signs: Temp: 97.8 F (36.6 C) (12/02 1257) Temp Source: Oral (12/02 1257) BP: 130/96 (12/02 1611) Pulse Rate: 100 (12/02 1611)  Labs: Recent Labs    05/30/20 1520  HGB 14.5  HCT 44.1  PLT 216  CREATININE 1.17*  TROPONINIHS 1,550*    Estimated Creatinine Clearance: 83.1 mL/min (A) (by C-G formula based on SCr of 1.17 mg/dL (H)).   Medical History: Past Medical History:  Diagnosis Date  . Cellulitis and abscess     Medications:  Scheduled:  . furosemide  20 mg Intravenous Once  . heparin  6,300 Units Intravenous Once    Assessment: Patient is a 26 yof that presents to the ED with complaints of shortness of breath. The patient was found to have bilateral PEs and pharmacy has been asked to dose heparin at this time.     Goal of Therapy:  Heparin level 0.3-0.7 units/ml Monitor platelets by anticoagulation protocol: Yes   Plan:  - Heparin bolus 6300 units IV x 1 dose - Heparin drip @ 1550 units/hr - Heparin level in ~ 6 hours  - Monitor patient for s/s of bleeding and CBC while on heparin   57 PharmD. BCPS  05/30/2020,4:58 PM

## 2020-05-30 NOTE — ED Notes (Signed)
Patient denies pain and is resting comfortably.  

## 2020-05-30 NOTE — ED Notes (Signed)
Pt on monitor and vitals cycling 

## 2020-05-30 NOTE — H&P (Signed)
History and Physical    MARDEL GRUDZIEN OAC:[id number] DOB: Aug 31, 1970 DOA: 05/30/2020  PCP: System, Provider Not In  Patient coming from: [facility]  I have personally briefly reviewed patient's old medical records in [place]  Chief Complaint: Shortness of breath  HPI: [name] is a 49 y.o. female with medical history significant for obesity who presented to [facility] for evaluation of shortness of breath.  Patient states she has been having approximately 1 month of exertional shortness of breath.  She initially felt this was related to seasonal allergies due to symptoms beginning after exposure to pollen.  About 2 weeks ago she was treated with Medrol Dosepak and albuterol inhaler without significant improvement.  Morning of 05/30/2020 she had significantly worsened shortness of breath which was occurring even while she was at rest.  She has been having a nonproductive cough.  She says last night she was having alternating chills and diaphoresis.  She has not had any chest pain, nausea, vomiting, abdominal pain, dysuria.  She has not noticed any swelling in her lower extremities.  She does say that she was having cramping in her legs about 5 months ago which have since resolved after starting magnesium supplementation.  She says she works at home and does spend 8-9 hours sitting down during workdays.  She denies any history of tobacco use.  She does not use any estrogen containing products or OCPs.  She denies any recent travel or surgeries.  She denies any personal history of blood clots.  She is not aware of any history of blood clots in her family.  ED Course:  Initial vitals showed BP 141/96, pulse 106, RR 17, temp 97.8 Fahrenheit, SPO2 97% on 2 L of home O2 via [place].  Labs significant for D-dimer 12.50, high-sensitivity troponin I 1550 > 1266, BNP 466.8, WBC 6.4, hemoglobin 14.5, platelets 216,000, sodium 138, potassium 4.1, bicarb 22, BUN 11,  creatinine 1.17, AST 64, ALT 96, alk phos 97, total bilirubin 1.2.  Urine pregnancy test is negative.  Urinalysis showed positive nitrites, negative leukocytes, 6-10 RBC/hpf, 0-5 WBC/hpf, many bacteria microscopy.  Portable chest x-ray showed cardiomegaly with central pulmonary vascular engorgement.  CTA chest PE study was significant for acute large central obstructing PE with saddle configuration and near occlusive emboli extending into the upper and lower lobe pulmonary arteries bilaterally.  There was CT evidence of right heart strain.  Patient was given aspirin 324 mg, IV Lasix 20 mg, and started on IV heparin.  The hospitalist service was consulted to admit for further evaluation and management.  Review of Systems: All systems reviewed and are negative except as documented in history of present illness above.   Past Medical History:  Diagnosis Date  . Cellulitis and abscess     Past Surgical History:  Procedure Laterality Date  . ABDOMINAL HYSTERECTOMY    . CHOLECYSTECTOMY    . KNEE SURGERY      Social History:  reports that she has never smoked. She does not have any smokeless tobacco history on file. She reports current alcohol use. She reports that she does not use drugs.  Allergies  Allergen Reactions  . Sulfa Antibiotics Rash    History reviewed. No pertinent family history.   Prior to Admission medications   Medication Sig Start Date End Date Taking? Authorizing Provider  fluticasone (FLONASE) 50 MCG/ACT nasal spray Place 1 spray into both nostrils daily. 12/15/15   Ward, Ozella Almond,  PA-C  hydrocortisone cream 1 % Apply 1 application topically 2 (two) times daily.    [provider]    Physical Exam: Vitals:   05/30/20 1700 05/30/20 1716 05/30/20 1748 05/30/20 1937  BP:  (!) 152/106 (!) 164/103 (!) 137/99  Pulse: 100 (!) 107 (!) 102 (!) 106  Resp:  (!) 26 20 (!) 25  Temp:   97.8 F (36.6 C)   TempSrc:      SpO2: 92% 97% 95% 97%  Weight:       Height:       Constitutional: Resting in bed, NAD, calm, comfortable Eyes: PERRL, lids and conjunctivae normal ENMT: Mucous membranes are moist. Posterior pharynx clear of any exudate or lesions.Normal dentition.  Neck: normal, supple, no masses. Respiratory: clear to auscultation bilaterally, no wheezing, no crackles. Normal respiratory effort. No accessory muscle use.  Cardiovascular: Regular rate and rhythm, no murmurs / rubs / gallops. No extremity edema. 2+ pedal pulses. Abdomen: no tenderness, no masses palpated. No hepatosplenomegaly. Bowel sounds positive.  Musculoskeletal: no clubbing / cyanosis. No joint deformity upper and lower extremities. Good ROM, no contractures. Normal muscle tone.  Skin: no rashes, lesions, ulcers. No induration Neurologic: CN 2-12 grossly intact. Sensation intact, Strength 5/5 in all 4.  Psychiatric: Normal judgment and insight. Alert and oriented x 3. Normal mood.   Labs on Admission: I have personally reviewed following labs and imaging studies  CBC: Recent Labs  Lab 05/30/20 1520 05/30/20 1721  WBC 6.4 7.0  NEUTROABS 3.4  --   HGB 14.5 16.0*  HCT 44.1 49.4*  MCV 89.8 90.3  PLT 216 010   Basic Metabolic Panel: Recent Labs  Lab 05/30/20 1520  NA 138  K 4.1  CL 106  CO2 22  GLUCOSE 143*  BUN 11  CREATININE 1.17*  CALCIUM 8.9   GFR: Estimated Creatinine Clearance: 83.1 mL/min (A) (by C-G formula based on SCr of 1.17 mg/dL (H)). Liver Function Tests: Recent Labs  Lab 05/30/20 1520  AST 64*  ALT 96*  ALKPHOS 97  BILITOT 1.2  PROT 7.3  ALBUMIN 3.6   No results for input(s): LIPASE, AMYLASE in the last 168 hours. No results for input(s): AMMONIA in the last 168 hours. Coagulation Profile: No results for input(s): INR, PROTIME in the last 168 hours. Cardiac Enzymes: No results for input(s): CKTOTAL, CKMB, CKMBINDEX, TROPONINI in the last 168 hours. BNP (last 3 results) No results for input(s): PROBNP in the last 8760  hours. HbA1C: No results for input(s): HGBA1C in the last 72 hours. CBG: No results for input(s): GLUCAP in the last 168 hours. Lipid Profile: No results for input(s): CHOL, HDL, LDLCALC, TRIG, CHOLHDL, LDLDIRECT in the last 72 hours. Thyroid Function Tests: No results for input(s): TSH, T4TOTAL, FREET4, T3FREE, THYROIDAB in the last 72 hours. Anemia Panel: No results for input(s): VITAMINB12, FOLATE, FERRITIN, TIBC, IRON, RETICCTPCT in the last 72 hours. Urine analysis:    Component Value Date/Time   COLORURINE YELLOW 05/30/2020 1541   APPEARANCEUR CLOUDY (A) 05/30/2020 1541   LABSPEC 1.025 05/30/2020 1541   PHURINE 6.0 05/30/2020 1541   GLUCOSEU NEGATIVE 05/30/2020 1541   HGBUR LARGE (A) 05/30/2020 1541   BILIRUBINUR NEGATIVE 05/30/2020 1541   KETONESUR NEGATIVE 05/30/2020 1541   PROTEINUR NEGATIVE 05/30/2020 1541   UROBILINOGEN 4.0 (H) 04/21/2015 1739   NITRITE POSITIVE (A) 05/30/2020 1541   LEUKOCYTESUR NEGATIVE 05/30/2020 1541    Radiological Exams on Admission: CT Angio Chest PE W and/or Wo Contrast  Result  Date: 05/30/2020 CLINICAL DATA:  Evaluate for pulmonary embolus. EXAM: CT ANGIOGRAPHY CHEST WITH CONTRAST TECHNIQUE: Multidetector CT imaging of the chest was performed using the standard protocol during bolus administration of intravenous contrast. Multiplanar CT image reconstructions and MIPs were obtained to evaluate the vascular anatomy. CONTRAST:  152m OMNIPAQUE IOHEXOL 350 MG/ML SOLN COMPARISON:  None. FINDINGS: Cardiovascular: Examination is positive for large central obstructing pulmonary embolus with saddle configuration, image 33/4. Near occlusive emboli are identified extending into the right upper lobe, right lower lobe, left upper lobe, and left lower lobe pulmonary arteries. There is CT evidence of right heart strain (RV/LV Ratio = 1.2). Mediastinum/Nodes: No enlarged mediastinal, hilar, or axillary lymph nodes. Thyroid gland, trachea, and esophagus demonstrate  no significant findings. Lungs/Pleura: Lungs are clear. No pleural effusion or edema. No airspace consolidation identified. Upper Abdomen: No acute abnormality. Musculoskeletal: Multilevel degenerative disc disease identified. No acute or suspicious osseous findings. Review of the MIP images confirms the above findings. IMPRESSION: Examination is positive for acute PE with CT evidence of right heart strain (RV/LV Ratio 1.2) consistent with at least submassive (intermediate risk) PE. The presence of right heart strain has been associated with an increased risk of morbidity and mortality. Critical Value/emergent results were called by telephone at the time of interpretation on 05/30/2020 at 5:06 pm to provider LRayna Sexton PA , who verbally acknowledged these results. Electronically Signed   By: TKerby MoorsM.D.   On: 05/30/2020 17:07   DG Chest Portable 1 View  Result Date: 05/30/2020 CLINICAL DATA:  Shortness of breath, beginning approximately 1 month ago EXAM: PORTABLE CHEST 1 VIEW COMPARISON:  April 21, 2015 FINDINGS: Heart size is enlarged, likely increased size since 2016. Central pulmonary vascular engorgement. Lungs are clear.  No sign of effusion on frontal view. On limited assessment no acute skeletal process. IMPRESSION: Cardiomegaly with central pulmonary vascular engorgement. Heart size increased compared to the prior exam. Correlate with any signs of heart failure or volume overload. No overt signs of pulmonary edema. Electronically Signed   By: GZetta BillsM.D.   On: 05/30/2020 15:13    EKG: Personally reviewed. Sinus tachycardia, rate 106, RAD, T wave inversions in leads III and aVF.  T wave changes and tachycardia new when compared to prior.  Assessment/Plan Principal Problem:   Acute pulmonary embolism (HCC) Active Problems:   Elevated troponin   AKYLANI WIRESis a 49y.o. female with medical history significant for obesity who is admitted with acute PE.  Acute saddle  PE with right heart strain: Seen on CTA chest 05/30/2020 with large central obstructing PE with saddle configuration and near occlusive emboli extending into the bilateral upper and lower lobe pulmonary arteries.  No obvious provoking factors other than long periods of sitting related to work.  Patient is otherwise hemodynamically stable. -Continue IV heparin, likely needs 24-48 hours before transition to oral anticoagulation -Obtain echocardiogram -Obtain lower extremity Dopplers to assess for any extensive DVT -Supplemental oxygen as needed -Monitor on telemetry  Elevated troponin: Elevated high-sensitivity troponin to 1550 with downtrend on repeat to 1266.  Likely secondary to strain from acute PE.  Patient denies any chest pain.  Continue heparin and monitor on telemetry as above.  DVT prophylaxis: IV heparin Code Status: Full code, confirmed with patient Family Communication: Discussed with patient, she has discussed with family Disposition Plan: From home and likely discharge to home pending further work-up and ability to transition to oral anticoagulant Consults called: None Admission status:  Status  is: Inpatient  Remains inpatient appropriate because:IV treatments appropriate due to intensity of illness or inability to take PO and Inpatient level of care appropriate due to severity of illness   Dispo: The patient is from: Home              Anticipated d/c is to: Home              Anticipated d/c date is: 2 days              Patient currently is not medically stable to d/c.   Zada Finders MD [facility]  If 7PM-7AM, please contact night-coverage [email or web address]  05/30/2020, 8:20 PM

## 2020-05-30 NOTE — ED Triage Notes (Addendum)
Shortness of breath approximently a month ago after exposure to pollen. Pt went to allergist after symptoms started. Oral course of steroids finished a week ago. Per pt symptoms have not improved. Pt reports shortness of breath upon exertion is getting wrose. Per EMS pt was 91% on RA. Pt placed on 2L [place] and noted to be  98%.

## 2020-05-30 NOTE — ED Provider Notes (Signed)
[facility] EMERGENCY DEPARTMENT Provider Note   CSN: [id number] Arrival date & time: 05/30/20  1250     History Chief Complaint  Patient presents with  . Shortness of Breath    [name] is a 49 y.o. female.  HPI Patient is a 49 year old female that presents the emergency department due to shortness of breath that started about 1 month ago.  Patient states that she went outside and began experiencing her symptoms which have persisted.  She reports an associated productive cough with "thick sputum". No hemoptysis. She states her cough is constant.  She felt that her shortness of breath was a worse today than normal and she was becoming lightheaded just ambulating through her house.  CP when coughing but no other CP. She contacted EMS who found her hypoxic down to the low 90s on room air.  She was placed on 2 L via nasal cannula and has been saturating in the high 90s.  Patient was evaluated by her PCP as well as an allergist for her symptoms this month.  She has taken a Medrol Dosepak and notes mild improvement at first but overall no significant improvement.  She reports some associated congestion as well.  No other complaints at this time.  She has been vaccinated for COVID-19.  Denies any history of blood clots, recent travel, recent trauma or surgery, immobilization, new leg swelling or calf pain.  No dysuria, vomiting, fevers.     Past Medical History:  Diagnosis Date  . Cellulitis and abscess     There are no problems to display for this patient.   Past Surgical History:  Procedure Laterality Date  . ABDOMINAL HYSTERECTOMY    . CHOLECYSTECTOMY    . KNEE SURGERY       OB History   No obstetric history on file.     History reviewed. No pertinent family history.  Social History   Tobacco Use  . Smoking status: Never Smoker  Substance Use Topics  . Alcohol use: Yes    Comment: occ  . Drug use: Never    Home Medications Prior to Admission  medications   Medication Sig Start Date End Date Taking? Authorizing Provider  fluticasone (FLONASE) 50 MCG/ACT nasal spray Place 1 spray into both nostrils daily. 12/15/15   Ward, Chase Picket, PA-C  hydrocortisone cream 1 % Apply 1 application topically 2 (two) times daily.    [provider]    Allergies    Sulfa antibiotics  Review of Systems   Review of Systems  All other systems reviewed and are negative. Ten systems reviewed and are negative for acute change, except as noted in the HPI.   Physical Exam Updated Vital Signs BP 133/82 (BP Location: Right Arm)   Pulse (!) 103   Temp 97.8 F (36.6 C) (Oral)   Resp (!) 24   Ht 5\' 5"  (1.651 m)   Wt 136.1 kg   SpO2 99%   BMI 49.92 kg/m   Physical Exam Vitals and nursing note reviewed.  Constitutional:      General: She is not in acute distress.    Appearance: Normal appearance. She is well-developed. She is obese. She is not ill-appearing, toxic-appearing or diaphoretic.  HENT:     Head: Normocephalic and atraumatic.     Right Ear: External ear normal.     Left Ear: External ear normal.     Nose: Nose normal.     Mouth/Throat:     Mouth: Mucous membranes  are moist.     Pharynx: Oropharynx is clear. No oropharyngeal exudate or posterior oropharyngeal erythema.  Eyes:     Extraocular Movements: Extraocular movements intact.  Cardiovascular:     Rate and Rhythm: Regular rhythm. Tachycardia present.     Pulses: Normal pulses.     Heart sounds: Normal heart sounds. No murmur heard.  No friction rub. No gallop.      Comments: Tachycardic between 105 to 115 bpm.  No murmurs, rubs, gallops. Pulmonary:     Effort: Pulmonary effort is normal. No respiratory distress.     Breath sounds: Normal breath sounds. No stridor. No decreased breath sounds, wheezing, rhonchi or rales.     Comments: Lungs are clear to auscultation bilaterally.  Mildly tachypneic.  Oxygen saturations in the high 90s on 2 L via nasal cannula.   Oxygen removed and patient stood in place with saturations dropping between 92 to 96% on room air. Chest:     Chest wall: No tenderness.  Abdominal:     General: Abdomen is flat.     Palpations: Abdomen is soft.     Tenderness: There is no abdominal tenderness.     Comments: Protuberant abdomen that is soft and nontender.  Musculoskeletal:        General: Normal range of motion.     Cervical back: Normal range of motion and neck supple. No tenderness.     Right lower leg: No tenderness. No edema.     Left lower leg: No tenderness. No edema.     Comments: Difficult to assess lower extremity secondary to body habitus.  No pitting edema appreciated.  No calf tenderness.  No unilateral leg swelling visualized.  Skin:    General: Skin is warm and dry.  Neurological:     General: No focal deficit present.     Mental Status: She is alert and oriented to person, place, and time.  Psychiatric:        Mood and Affect: Mood normal.        Behavior: Behavior normal.    ED Results / Procedures / Treatments   Labs (all labs ordered are listed, but only abnormal results are displayed) Labs Reviewed  COMPREHENSIVE METABOLIC PANEL - Abnormal; Notable for the following components:      Result Value   Glucose, Bld 143 (*)    Creatinine, Ser 1.17 (*)    AST 64 (*)    ALT 96 (*)    GFR, Estimated 58 (*)    All other components within normal limits  D-DIMER, QUANTITATIVE (NOT AT [facility]) - Abnormal; Notable for the following components:   D-Dimer, Quant 12.50 (*)    All other components within normal limits  URINALYSIS, ROUTINE W REFLEX MICROSCOPIC - Abnormal; Notable for the following components:   APPearance CLOUDY (*)    Hgb urine dipstick LARGE (*)    Nitrite POSITIVE (*)    All other components within normal limits  BRAIN NATRIURETIC PEPTIDE - Abnormal; Notable for the following components:   B Natriuretic Peptide 466.8 (*)    All other components within normal limits  URINALYSIS,  MICROSCOPIC (REFLEX) - Abnormal; Notable for the following components:   Bacteria, UA MANY (*)    All other components within normal limits  TROPONIN I (HIGH SENSITIVITY) - Abnormal; Notable for the following components:   Troponin I (High Sensitivity) 1,550 (*)    All other components within normal limits  RESP PANEL BY RT-PCR (FLU A&B, COVID) ARPGX2  CBC WITH  DIFFERENTIAL/PLATELET  PREGNANCY, URINE  CBC  TROPONIN I (HIGH SENSITIVITY)   EKG None  Radiology CT Angio Chest PE W and/or Wo Contrast  Result Date: 05/30/2020 CLINICAL DATA:  Evaluate for pulmonary embolus. EXAM: CT ANGIOGRAPHY CHEST WITH CONTRAST TECHNIQUE: Multidetector CT imaging of the chest was performed using the standard protocol during bolus administration of intravenous contrast. Multiplanar CT image reconstructions and MIPs were obtained to evaluate the vascular anatomy. CONTRAST:  100mL OMNIPAQUE IOHEXOL 350 MG/ML SOLN COMPARISON:  None. FINDINGS: Cardiovascular: Examination is positive for large central obstructing pulmonary embolus with saddle configuration, image 33/4. Near occlusive emboli are identified extending into the right upper lobe, right lower lobe, left upper lobe, and left lower lobe pulmonary arteries. There is CT evidence of right heart strain (RV/LV Ratio = 1.2). Mediastinum/Nodes: No enlarged mediastinal, hilar, or axillary lymph nodes. Thyroid gland, trachea, and esophagus demonstrate no significant findings. Lungs/Pleura: Lungs are clear. No pleural effusion or edema. No airspace consolidation identified. Upper Abdomen: No acute abnormality. Musculoskeletal: Multilevel degenerative disc disease identified. No acute or suspicious osseous findings. Review of the MIP images confirms the above findings. IMPRESSION: Examination is positive for acute PE with CT evidence of right heart strain (RV/LV Ratio 1.2) consistent with at least submassive (intermediate risk) PE. The presence of right heart strain has been  associated with an increased risk of morbidity and mortality. Critical Value/emergent results were called by telephone at the time of interpretation on 05/30/2020 at 5:06 pm to provider Placido SouLOGAN Christofer Shen, PA , who verbally acknowledged these results. Electronically Signed   By: Signa Kellaylor  Stroud M.D.   On: 05/30/2020 17:07   DG Chest Portable 1 View  Result Date: 05/30/2020 CLINICAL DATA:  Shortness of breath, beginning approximately 1 month ago EXAM: PORTABLE CHEST 1 VIEW COMPARISON:  April 21, 2015 FINDINGS: Heart size is enlarged, likely increased size since 2016. Central pulmonary vascular engorgement. Lungs are clear.  No sign of effusion on frontal view. On limited assessment no acute skeletal process. IMPRESSION: Cardiomegaly with central pulmonary vascular engorgement. Heart size increased compared to the prior exam. Correlate with any signs of heart failure or volume overload. No overt signs of pulmonary edema. Electronically Signed   By: Donzetta KohutGeoffrey  Wile M.D.   On: 05/30/2020 15:13   Procedures Procedures (including critical care time)  Medications Ordered in ED Medications  heparin ADULT infusion 100 units/mL (25000 units/26050mL sodium chloride 0.45%) (1,550 Units/hr Intravenous New Bag/Given 05/30/20 1714)  furosemide (LASIX) injection 20 mg (20 mg Intravenous Given 05/30/20 1657)  aspirin chewable tablet 324 mg (324 mg Oral Given 05/30/20 1656)  iohexol (OMNIPAQUE) 350 MG/ML injection 100 mL (100 mLs Intravenous Contrast Given 05/30/20 1641)  heparin bolus via infusion 6,300 Units (6,300 Units Intravenous Bolus from Bag 05/30/20 1715)   ED Course  I have reviewed the triage vital signs and the nursing notes.  Pertinent labs & imaging results that were available during my care of the patient were reviewed by me and considered in my medical decision making (see chart for details).  Clinical Course as of May 30 1717  Thu May 30, 2020  1526 IMPRESSION: Cardiomegaly with central pulmonary  vascular engorgement. Heart size increased compared to the prior exam. Correlate with any signs of heart failure or volume overload. No overt signs of pulmonary edema.  DG Chest Portable 1 View [LJ]  1618 Nitrite(!): POSITIVE [LJ]  1618 Hgb urine dipstick(!): LARGE [LJ]  1618 B Natriuretic Peptide(!): 466.8 [LJ]  1618 D-Dimer, Quant(!): 12.50 [LJ]  1625  Troponin I (High Sensitivity)(!!): 1,550 [LJ]  1639 Sinus tach, right axis deviation, nonspecific T wave abnormalities in the inferior leads.  EKG 12-Lead [LJ]  1639 Troponin I (High Sensitivity)(!!): 1,550 [LJ]  1639 Given IV Lasix.  B Natriuretic Peptide(!): 466.8 [LJ]  1639 Will obtain a CTA of the chest.  D-Dimer, Quant(!): 12.50 [LJ]  1715 I spoke to Dr. Jarvis Newcomer with the hospitalist team.  They will accept the patient into their care at this time.   [LJ]    Clinical Course User Index [LJ] Placido Sou, PA-C   MDM Rules/Calculators/A&P                          Patient is a 49 year old female that presents the emergency department due to a pulmonary embolism today.  Patient has been having progressive shortness of breath of the past month that appeared to acutely worsen earlier today.  She was feeling lightheaded and found to be mildly hypoxic in the low 90s by EMS.  No history of pulmonary issues or blood clot.  No known risk factors for blood clot.  No history of IVDA.  Chest x-ray initially showing some central pulmonary vascular engorgement.  Cardiomegaly.  I obtained a BNP mildly elevated at 466.8.  Initial troponin elevated at 1550.  ECG with right axis deviation, sinus tach, and nonspecific T wave abnormalities.  D-dimer significantly elevated at 12.5.  I obtained the CT scan of the chest.  Radiology is stating that it is at least a submassive PE with right heart strain.  RV/LV ratio of 1.2.  Patient started on heparin.  Discussed with the hospitalist team who will accept the patient to their care at this time.  Final  Clinical Impression(s) / ED Diagnoses Final diagnoses:  Acute pulmonary embolism, unspecified pulmonary embolism type, unspecified whether acute cor pulmonale present [facility])    Rx / DC Orders ED Discharge Orders    None       Placido Sou, PA-C 05/30/20 1722    [facility], MD 06/07/20 1551

## 2020-05-31 ENCOUNTER — Inpatient Hospital Stay (HOSPITAL_COMMUNITY): Payer: Federal, State, Local not specified - PPO

## 2020-05-31 DIAGNOSIS — I2692 Saddle embolus of pulmonary artery without acute cor pulmonale: Secondary | ICD-10-CM | POA: Diagnosis not present

## 2020-05-31 DIAGNOSIS — I2699 Other pulmonary embolism without acute cor pulmonale: Secondary | ICD-10-CM

## 2020-05-31 DIAGNOSIS — I2602 Saddle embolus of pulmonary artery with acute cor pulmonale: Secondary | ICD-10-CM

## 2020-05-31 DIAGNOSIS — R778 Other specified abnormalities of plasma proteins: Secondary | ICD-10-CM

## 2020-05-31 DIAGNOSIS — I2609 Other pulmonary embolism with acute cor pulmonale: Secondary | ICD-10-CM | POA: Diagnosis not present

## 2020-05-31 LAB — TROPONIN I (HIGH SENSITIVITY): Troponin I (High Sensitivity): 876 ng/L (ref <18)

## 2020-05-31 LAB — BLOOD GAS, ARTERIAL
Acid-base deficit: 1.1 mmol/L (ref 0.0–2.0)
Bicarbonate: 21.3 mmol/L (ref 20.0–28.0)
O2 Saturation: 90.4 %
Patient temperature: 98.6
pCO2 arterial: 30.5 mmHg — ABNORMAL LOW (ref 32.0–48.0)
pH, Arterial: 7.459 — ABNORMAL HIGH (ref 7.350–7.450)
pO2, Arterial: 62.1 mmHg — ABNORMAL LOW (ref 83.0–108.0)

## 2020-05-31 LAB — BASIC METABOLIC PANEL
Anion gap: 12 (ref 5–15)
BUN: 11 mg/dL (ref 6–20)
CO2: 17 mmol/L — ABNORMAL LOW (ref 22–32)
Calcium: 8.8 mg/dL — ABNORMAL LOW (ref 8.9–10.3)
Chloride: 107 mmol/L (ref 98–111)
Creatinine, Ser: 1.2 mg/dL — ABNORMAL HIGH (ref 0.44–1.00)
GFR, Estimated: 56 mL/min — ABNORMAL LOW (ref >60)
Glucose, Bld: 200 mg/dL — ABNORMAL HIGH (ref 70–99)
Potassium: 3.7 mmol/L (ref 3.5–5.1)
Sodium: 136 mmol/L (ref 135–145)

## 2020-05-31 LAB — CBC
HCT: 40.4 % (ref 36.0–46.0)
HCT: 44.3 % (ref 36.0–46.0)
HCT: 45 % (ref 36.0–46.0)
Hemoglobin: 12.9 g/dL (ref 12.0–15.0)
Hemoglobin: 14 g/dL (ref 12.0–15.0)
Hemoglobin: 14.1 g/dL (ref 12.0–15.0)
MCH: 29.1 pg (ref 26.0–34.0)
MCH: 29.2 pg (ref 26.0–34.0)
MCH: 29.5 pg (ref 26.0–34.0)
MCHC: 31.1 g/dL (ref 30.0–36.0)
MCHC: 31.8 g/dL (ref 30.0–36.0)
MCHC: 31.9 g/dL (ref 30.0–36.0)
MCV: 91.5 fL (ref 80.0–100.0)
MCV: 92.2 fL (ref 80.0–100.0)
MCV: 93.9 fL (ref 80.0–100.0)
Platelets: 200 10*3/uL (ref 150–400)
Platelets: 203 10*3/uL (ref 150–400)
Platelets: 216 10*3/uL (ref 150–400)
RBC: 4.38 MIL/uL (ref 3.87–5.11)
RBC: 4.79 MIL/uL (ref 3.87–5.11)
RBC: 4.84 MIL/uL (ref 3.87–5.11)
RDW: 14.6 % (ref 11.5–15.5)
RDW: 14.7 % (ref 11.5–15.5)
RDW: 14.7 % (ref 11.5–15.5)
WBC: 6.3 10*3/uL (ref 4.0–10.5)
WBC: 6.7 10*3/uL (ref 4.0–10.5)
WBC: 7.2 10*3/uL (ref 4.0–10.5)
nRBC: 0 % (ref 0.0–0.2)
nRBC: 0 % (ref 0.0–0.2)
nRBC: 0 % (ref 0.0–0.2)

## 2020-05-31 LAB — ECHOCARDIOGRAM COMPLETE
Area-P 1/2: 3.06 cm2
Height: 65 in
S' Lateral: 2.5 cm
Weight: 4740.77 oz

## 2020-05-31 LAB — HEPARIN LEVEL (UNFRACTIONATED)
Heparin Unfractionated: 1.03 IU/mL — ABNORMAL HIGH (ref 0.30–0.70)
Heparin Unfractionated: 0.82 IU/mL — ABNORMAL HIGH (ref 0.30–0.70)
Heparin Unfractionated: 0.78 IU/mL — ABNORMAL HIGH (ref 0.30–0.70)

## 2020-05-31 LAB — PROTIME-INR
INR: 1.1 (ref 0.8–1.2)
INR: 1.1 (ref 0.8–1.2)
Prothrombin Time: 14.2 seconds (ref 11.4–15.2)
Prothrombin Time: 13.7 seconds (ref 11.4–15.2)

## 2020-05-31 LAB — APTT
aPTT: 81 seconds — ABNORMAL HIGH (ref 24–36)
aPTT: 36 seconds (ref 24–36)
aPTT: 84 seconds — ABNORMAL HIGH (ref 24–36)
aPTT: 52 seconds — ABNORMAL HIGH (ref 24–36)

## 2020-05-31 LAB — LACTIC ACID, PLASMA: Lactic Acid, Venous: 2.9 mmol/L (ref 0.5–1.9)

## 2020-05-31 LAB — HIV ANTIBODY (ROUTINE TESTING W REFLEX): HIV Screen 4th Generation wRfx: NONREACTIVE

## 2020-05-31 MED ORDER — HEPARIN (PORCINE) 25000 UT/250ML-% IV SOLN
1350.0000 [IU]/h | INTRAVENOUS | Status: DC
  Administered 2020-05-31: 1350 [IU]/h via INTRAVENOUS
  Filled 2020-05-31: qty 250

## 2020-05-31 MED ORDER — HYDRALAZINE HCL 20 MG/ML IJ SOLN
10.0000 mg | INTRAMUSCULAR | Status: AC | PRN
  Administered 2020-06-01 (×3): 10 mg via INTRAVENOUS
  Filled 2020-05-31 (×3): qty 1

## 2020-05-31 MED ORDER — SODIUM CHLORIDE 0.9% FLUSH: 10.0000 mL | INTRAVENOUS | Status: DC | PRN

## 2020-05-31 MED ORDER — HEPARIN (PORCINE) 25000 UT/250ML-% IV SOLN: 1250.0000 [IU]/h | INTRAVENOUS | Status: DC

## 2020-05-31 MED ORDER — SODIUM CHLORIDE 0.9 % IV SOLN
250.0000 mL | INTRAVENOUS | Status: AC
  Administered 2020-05-31: 250 mL via INTRAVENOUS

## 2020-05-31 MED ORDER — ALTEPLASE (PULMONARY EMBOLISM) INFUSION
100.0000 mg | INTRAVENOUS | Status: AC
  Administered 2020-05-31: 100 mg via INTRAVENOUS
  Filled 2020-05-31: qty 100

## 2020-05-31 MED ORDER — SODIUM CHLORIDE 0.9% FLUSH
10.0000 mL | Freq: Two times a day (BID) | INTRAVENOUS | Status: DC
  Administered 2020-05-31 – 2020-06-04 (×7): 10 mL

## 2020-05-31 NOTE — TOC Initial Note (Signed)
Transition of Care [facility]) - Initial/Assessment Note    Patient Details  Name: [name] MRN: [id number] Date of Birth: Aug 27, 1970  Transition of Care [facility]) CM/SW Contact:    Leeroy Cha, RN Phone Number: 05/31/2020, 7:58 AM  Clinical Narrative:                 49 y.o. female with medical history significant for obesity who presented to [place] ED for evaluation of shortness of breath.  Patient states she has been having approximately 1 month of exertional shortness of breath.  She initially felt this was related to seasonal allergies due to symptoms beginning after exposure to pollen.  About 2 weeks ago she was treated with Medrol Dosepak and albuterol inhaler without significant improvement.  Morning of 05/30/2020 she had significantly worsened shortness of breath which was occurring even while she was at rest.  She has been having a nonproductive cough.  She says last night she was having alternating chills and diaphoresis.  She has not had any chest pain, nausea, vomiting, abdominal pain, dysuria.  She has not noticed any swelling in her lower extremities.  She does say that she was having cramping in her legs about 5 months ago which have since resolved after starting magnesium supplementation.  She says she works at home and does spend 8-9 hours sitting down during workdays.  She denies any history of tobacco use.  She does not use any estrogen containing products or OCPs.  She denies any recent travel or surgeries.  She denies any personal history of blood clots.  She is not aware of any history of blood clots in her family.  ED Course:  Initial vitals showed BP 141/96, pulse 106, RR 17, temp 97.8 Fahrenheit, SPO2 97% on 2 L of home O2 via [place].  Labs significant for D-dimer 12.50, high-sensitivity troponin I 1550 > 1266, BNP 466.8, WBC 6.4, hemoglobin 14.5, platelets 216,000, sodium 138, potassium 4.1, bicarb 22, BUN 11, creatinine 1.17, AST 64, ALT 96, alk  phos 97, total bilirubin 1.2.  Urine pregnancy test is negative.  Urinalysis showed positive nitrites, negative leukocytes, 6-10 RBC/hpf, 0-5 WBC/hpf, many bacteria microscopy.  Portable chest x-ray showed cardiomegaly with central pulmonary vascular engorgement.  CTA chest PE study was significant for acute large central obstructing PE with saddle configuration and near occlusive emboli extending into the upper and lower lobe pulmonary arteries bilaterally.  There was CT evidence of right heart strain.  Patient was given aspirin 324 mg, IV Lasix 20 mg, and started on IV heparin.  The hospitalist service was consulted to admit for further evaluation and management. PLAN: anticipate that pt will return to home with self care lives alone but has family for support foillowing for progression. Expected Discharge Plan: Home/Self Care Barriers to Discharge: No Barriers Identified   Patient Goals and CMS Choice Patient states their goals for this hospitalization and ongoing recovery are:: to go home CMS Medicare.gov Compare Post Acute Care list provided to:: Patient    Expected Discharge Plan and Services Expected Discharge Plan: Home/Self Care   Discharge Planning Services: CM Consult   Living arrangements for the past 2 months: Single Family Home                                      Prior Living Arrangements/Services Living arrangements for the past 2 months: Single Family Home Lives  with:: Self Patient language and need for interpreter reviewed:: Yes Do you feel safe going back to the place where you live?: Yes      Need for Family Participation in Patient Care: Yes (Comment) Care giver support system in place?: Yes (comment)   Criminal Activity/Legal Involvement Pertinent to Current Situation/Hospitalization: No - Comment as needed  Activities of Daily Living      Permission Sought/Granted                  Emotional Assessment Appearance:: Appears stated  age Attitude/Demeanor/Rapport: Engaged Affect (typically observed): Calm Orientation: : Oriented to Place, Oriented to Self, Oriented to  Time, Oriented to Situation Alcohol / Substance Use: Not Applicable Psych Involvement: No (comment)  Admission diagnosis:  Acute pulmonary embolism (HCC) [I26.99] Acute pulmonary embolism, unspecified pulmonary embolism type, unspecified whether acute cor pulmonale present ([place]) [I26.99] Patient Active Problem List   Diagnosis Date Noted  . Acute pulmonary embolism ([place]) 05/30/2020  . Elevated troponin 05/30/2020   PCP:  System, Provider Not In Pharmacy:   [id number] [place],  - [address], [address] [facility] [id number]: [phone]: [address] [address]Phone: [phone]: [phone]    Social Determinants of Health (SDOH) Interventions    Readmission Risk Interventions No flowsheet data found.

## 2020-05-31 NOTE — Progress Notes (Signed)
[facility] Progress Note  Patient: [name]    HQI:[id number]  DOA: 05/30/2020     Date of Service: the patient was seen and examined on 05/31/2020  Brief hospital course: No significant past medical history other than obesity.  Presents with complaints of 1 month of shortness of breath.  Found to have acute saddle PE. Currently plan is IV TPA per protocol under the guidance of PCCM.  Assessment and Plan: 1.  Acute saddle pulmonary embolism. Right heart strain. Elevated troponin. Acute hypoxic respiratory failure. Currently on 2 LPM. CT scan shows evidence of large saddle pulmonary embolism. Initially started on IV heparin. Lower extremity Doppler also positive for DVT. Given the size of the PE, elevation of the troponin as well as severe RV dysfunction seen on the echocardiogram PCCM was consulted. Highly appreciate their assistance. Understandably patient remains at high risk for catheter guided thrombolysis given her poor IV and therefore current plan is to pursue TPA. Continue to monitor in the stepdown unit. Repeat evaluation of RV function in 24 to 48 hours.  2.  Morbid obesity Body mass index is 49.31 kg/m.  Placing the patient at high risk for poor outcomes. We will monitor. Patient will benefit from outpatient dietary consultation.  3.  Hypercoagulability Etiology of patient's PE is not clear. She does not have any immobilization history not she has any medication history that can increase risk for PE. 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 We will check hypercoagulable panel tomorrow. Primarily looking for genetic markers rather than protein levels.  Diet: Regular diet   Advance goals of care discussion: Full code  Family Communication: family was present at bedside, at the time of interview.  The pt provided permission to discuss medical plan with the family. Opportunity was given to ask  question and all questions were answered satisfactorily.   Disposition:  Status is: Inpatient  Remains inpatient appropriate because:Inpatient level of care appropriate due to severity of illness   Dispo: The patient is from: Home              Anticipated d/c is to: Home              Anticipated d/c date is: > 3 days              Patient currently is not medically stable to d/c.   Subjective: No nausea no vomiting.  No chest pain.  No fever no chills.  Still has some breathing as well as dry cough.  Physical Exam:  General: Appear in moderate distress, no Rash; Oral Mucosa Clear, moist. no Abnormal Neck Mass Or lumps, Conjunctiva normal  Cardiovascular: S1 and S2 Present, no Murmur, Respiratory: increased respiratory effort, Bilateral Air entry present and bilateral  Crackles, no wheezes Abdomen: Bowel Sound present, Soft and no tenderness Extremities: bilateral  Pedal edema Neurology: alert and oriented to time, place, and person affect appropriate. no new focal deficit Gait not checked due to patient safety concerns  Vitals:   05/31/20 1815 05/31/20 1830 05/31/20 1845 05/31/20 1900  BP: (!) 153/95 (!) 146/97 (!) 138/97 (!) 162/97  Pulse: 99 94 91 91  Resp: (!) 24 (!) 22 (!) 23 15  Temp:      TempSrc:      SpO2: 93% 91% 91% 96%  Weight:      Height:        Intake/Output Summary (Last 24 hours) at 05/31/2020  2002 Last data filed at 05/31/2020 1800 Gross per 24 hour  Intake 383.49 ml  Output 450 ml  Net -66.51 ml   Filed Weights   05/30/20 1256 05/30/20 1937 05/31/20 0500  Weight: 136.1 kg 134.4 kg 134.4 kg    Data Reviewed: I have personally reviewed and interpreted daily labs, tele strips, imagings as discussed above. I reviewed all nursing notes, pharmacy notes, vitals, pertinent old records I have discussed plan of care as described above with RN and patient/family.  CBC: Recent Labs  Lab 05/30/20 1520 05/30/20 1721 05/31/20 0042 05/31/20 1730  05/31/20 1849  WBC 6.4 7.0 6.7 7.2 6.3  NEUTROABS 3.4  --   --   --   --   HGB 14.5 16.0* 14.0 14.1 12.9  HCT 44.1 49.4* 45.0 44.3 40.4  MCV 89.8 90.3 93.9 91.5 92.2  PLT 216 193 200 216 203   Basic Metabolic Panel: Recent Labs  Lab 05/30/20 1520 05/31/20 0042  NA 138 136  K 4.1 3.7  CL 106 107  CO2 22 17*  GLUCOSE 143* 200*  BUN 11 11  CREATININE 1.17* 1.20*  CALCIUM 8.9 8.8*    Studies: ECHOCARDIOGRAM COMPLETE  Result Date: 05/31/2020    ECHOCARDIOGRAM REPORT   Patient Name:   [name] [facility] Date of Exam: 05/31/2020 Medical Rec #:  [id number]        Height:       65.0 in Accession #:    [id number]       Weight:       296.3 lb Date of Birth:  [birth year]        BSA:          2.338 m Patient Age:    49 years         BP:           124/71 mmHg Patient Gender: F                HR:           88 bpm. Exam Location:  Inpatient Procedure: 2D Echo, Color Doppler and Cardiac Doppler Indications:    I26.02 Pulmonary embolus  History:        Patient has no prior history of Echocardiogram examinations.  Sonographer:    Irving Burton Senior RDCS Referring Phys: [id number] [phone]  Sonographer Comments: Poor apical window due to body habitus. IMPRESSIONS  1. Left ventricular ejection fraction, by estimation, is 60 to 65%. The left ventricle has normal function. The left ventricle has no regional wall motion abnormalities. Left ventricular diastolic parameters are consistent with Grade II diastolic dysfunction (pseudonormalization).  2. + McConnell's sign - c/w pulmonary embolus.     . Right ventricular systolic function is severely reduced. The right ventricular size is severely enlarged. There is severely elevated pulmonary artery systolic pressure. The estimated right ventricular systolic pressure is 77.7 mmHg.  3. Right atrial size was mildly dilated.  4. The mitral valve is normal in structure. No evidence of mitral valve regurgitation. No evidence of mitral stenosis.  5. The aortic valve is normal in  structure. Aortic valve regurgitation is not visualized. No aortic stenosis is present.  6. There is a large saddle pulmonary embolus at the bifurcation of the main pulmonary artery. FINDINGS  Left Ventricle: Left ventricular ejection fraction, by estimation, is 60 to 65%. The left ventricle has normal function. The left ventricle has no regional wall motion abnormalities. The left ventricular internal cavity size was normal in  size. There is  no left ventricular hypertrophy. Left ventricular diastolic parameters are consistent with Grade II diastolic dysfunction (pseudonormalization). Right Ventricle: + McConnell's sign - c/w pulmonary embolus. The right ventricular size is severely enlarged. Right vetricular wall thickness was not well visualized. Right ventricular systolic function is severely reduced. There is severely elevated pulmonary artery systolic pressure. The tricuspid regurgitant velocity is 3.96 m/s, and with an assumed right atrial pressure of 15 mmHg, the estimated right ventricular systolic pressure is 77.7 mmHg. Left Atrium: Left atrial size was normal in size. Right Atrium: Right atrial size was mildly dilated. Pericardium: There is no evidence of pericardial effusion. Mitral Valve: The mitral valve is normal in structure. No evidence of mitral valve regurgitation. No evidence of mitral valve stenosis. Tricuspid Valve: The tricuspid valve is normal in structure. Tricuspid valve regurgitation is mild . No evidence of tricuspid stenosis. Aortic Valve: The aortic valve is normal in structure. Aortic valve regurgitation is not visualized. No aortic stenosis is present. Pulmonic Valve: The pulmonic valve was grossly normal. Pulmonic valve regurgitation is not visualized. Aorta: The aortic root and ascending aorta are structurally normal, with no evidence of dilitation. Pulmonary Artery: There is a large saddle pulmonary embolus at the bifurcation of the main pulmonary artery. IAS/Shunts: The atrial  septum is grossly normal.  LEFT VENTRICLE PLAX 2D LVIDd:         3.70 cm  Diastology LVIDs:         2.50 cm  LV e' medial:    4.03 cm/s LV PW:         1.50 cm  LV E/e' medial:  6.3 LV IVS:        1.20 cm  LV e' lateral:   4.35 cm/s LVOT diam:     2.00 cm  LV E/e' lateral: 5.8 LV SV:         28 LV SV Index:   12 LVOT Area:     3.14 cm  RIGHT VENTRICLE RV S prime:     6.85 cm/s TAPSE (M-mode): 0.9 cm LEFT ATRIUM             Index       RIGHT ATRIUM           Index LA diam:        3.10 cm 1.33 cm/m  RA Area:     22.00 cm LA Vol (A2C):   37.0 ml 15.83 ml/m RA Volume:   74.20 ml  31.74 ml/m LA Vol (A4C):   43.3 ml 18.52 ml/m LA Biplane Vol: 41.8 ml 17.88 ml/m  AORTIC VALVE LVOT Vmax:   58.10 cm/s LVOT Vmean:  42.500 cm/s LVOT VTI:    0.090 m                          PULMONARY ARTERY AORTA                    MPA diam:        2.70 cm Ao Root diam: 2.80 cm Ao Asc diam:  3.40 cm MITRAL VALVE               TRICUSPID VALVE MV Area (PHT): 3.06 cm    TR Peak grad:   62.7 mmHg MV Decel Time: 248 msec    TR Vmax:        396.00 cm/s MV E velocity: 25.30 cm/s MV A velocity: 45.80 cm/s  SHUNTS MV E/A  ratio:  0.55        Systemic VTI:  0.09 m                            Systemic Diam: 2.00 cm Kristeen MissPhilip Nahser MD Electronically signed by Kristeen MissPhilip Nahser MD Signature Date/Time: 05/31/2020/2:03:01 PM    Final    VAS US LOWER EXTREMITY VENOUS (DVT)  Result Date: 05/31/2020  Lower Venous DVT Study Indications: Pulmonary embolism.  Comparison Study: no prior Performing Technologist: Blanch MediaMegan Riddle RVS  Examination Guidelines: A complete evaluation includes B-mode imaging, spectral Doppler, color Doppler, and power Doppler as needed of all accessible portions of each vessel. Bilateral testing is considered an integral part of a complete examination. Limited examinations for reoccurring indications may be performed as noted. The reflux portion of the exam is performed with the patient in reverse Trendelenburg.   +---------+---------------+---------+-----------+----------+--------------+  RIGHT     Compressibility Phasicity Spontaneity Properties Thrombus Aging  +---------+---------------+---------+-----------+----------+--------------+  CFV       Full            Yes       Yes                                    +---------+---------------+---------+-----------+----------+--------------+  SFJ       Full                                                             +---------+---------------+---------+-----------+----------+--------------+  FV Prox   Full                                                             +---------+---------------+---------+-----------+----------+--------------+  FV Mid    Full                                                             +---------+---------------+---------+-----------+----------+--------------+  FV Distal Full                                                             +---------+---------------+---------+-----------+----------+--------------+  PFV       Full                                                             +---------+---------------+---------+-----------+----------+--------------+  POP       Full            Yes  Yes                                    +---------+---------------+---------+-----------+----------+--------------+  PTV       Full                                                             +---------+---------------+---------+-----------+----------+--------------+  PERO      Full                                                             +---------+---------------+---------+-----------+----------+--------------+   +---------+---------------+---------+-----------+----------+-------------------+  LEFT      Compressibility Phasicity Spontaneity Properties Thrombus Aging       +---------+---------------+---------+-----------+----------+-------------------+  CFV       Full            Yes       No                                           +---------+---------------+---------+-----------+----------+-------------------+  SFJ       Full                                                                  +---------+---------------+---------+-----------+----------+-------------------+  FV Prox   Full                                                                  +---------+---------------+---------+-----------+----------+-------------------+  FV Mid    Full                                                                  +---------+---------------+---------+-----------+----------+-------------------+  FV Distal Full                                                                  +---------+---------------+---------+-----------+----------+-------------------+  PFV       Full                                                                  +---------+---------------+---------+-----------+----------+-------------------+  POP       None            No        No                     Age Indeterminate    +---------+---------------+---------+-----------+----------+-------------------+  PTV       None                                             Age Indeterminate    +---------+---------------+---------+-----------+----------+-------------------+  PERO                                                       Not well visualized  +---------+---------------+---------+-----------+----------+-------------------+     Summary: RIGHT: - There is no evidence of deep vein thrombosis in the lower extremity.  - No cystic structure found in the popliteal fossa.  LEFT: - Findings consistent with age indeterminate deep vein thrombosis involving the left popliteal vein, and left posterior tibial veins. - No cystic structure found in the popliteal fossa.  *See table(s) above for measurements and observations.    Preliminary     Scheduled Meds:  Chlorhexidine Gluconate Cloth  6 each Topical Daily   mouth rinse  15 mL Mouth Rinse BID   sodium chloride flush  10-40 mL  Intracatheter Q12H   sodium chloride flush  3 mL Intravenous Q12H   Continuous Infusions:  sodium chloride     alteplase 100 mg (05/31/20 1819)   PRN Meds: acetaminophen **OR** acetaminophen, ondansetron **OR** ondansetron (ZOFRAN) IV, sodium chloride flush  Time spent: 35 minutes  Author: Lynden Oxford, MD [facility] 05/31/2020 8:02 PM  To reach On-call, see care teams to locate the attending and reach out via [email or web address]. Between 7PM-7AM, please contact night-coverage If you still have difficulty reaching the attending provider, please page the [facility] (Director on Call) for [facility] on amion for assistance.

## 2020-05-31 NOTE — Progress Notes (Signed)
Lower extremity venous has been completed.   Preliminary results in CV Proc.   Blanch Media 05/31/2020 12:26 PM

## 2020-05-31 NOTE — Progress Notes (Signed)
CRITICAL VALUE ALERT  Critical Value:  Lactic 2.9  Date & Time Notied:  05/31/20 1845  Provider Notified: Dr. Kendrick Fries  Orders Received/Actions taken: no new orders

## 2020-05-31 NOTE — Progress Notes (Signed)
Echocardiogram 2D Echocardiogram has been performed.  [name] 05/31/2020, 10:45 AM

## 2020-05-31 NOTE — Progress Notes (Signed)
ANTICOAGULATION CONSULT NOTE  Pharmacy Consult for Heparin  Indication: pulmonary embolus  Allergies  Allergen Reactions  . Sulfa Antibiotics Rash    Patient Measurements: Height: 5\' 5"  (165.1 cm) Weight: 134.4 kg (296 lb 4.8 oz) IBW/kg (Calculated) : 57 Heparin Dosing Weight: 90.7 kg  Vital Signs: Temp: 97.4 F (36.3 C) (12/03 0000) Temp Source: Oral (12/03 0000) BP: 116/80 (12/03 0000) Pulse Rate: 93 (12/03 0000)  Labs: Recent Labs    05/30/20 1520 05/30/20 1520 05/30/20 1721 05/31/20 0042  HGB 14.5   < > 16.0* 14.0  HCT 44.1  --  49.4* 45.0  PLT 216  --  193 200  HEPARINUNFRC  --   --   --  1.03*  CREATININE 1.17*  --   --  1.20*  TROPONINIHS 1,550*  --  1,266*  --    < > = values in this interval not displayed.    Estimated Creatinine Clearance: 80.5 mL/min (A) (by C-G formula based on SCr of 1.2 mg/dL (H)).   Medical History: Past Medical History:  Diagnosis Date  . Cellulitis and abscess     Medications:  Scheduled:  . Chlorhexidine Gluconate Cloth  6 each Topical Daily  . mouth rinse  15 mL Mouth Rinse BID  . sodium chloride flush  3 mL Intravenous Q12H    Assessment: Patient is a 16 yof that presents to the ED with complaints of shortness of breath. The patient was found to have bilateral PEs and pharmacy has been asked to dose heparin at this time.     05/31/2020 HL 1.03 supra-therapeutic on 1550 units/hr CBC WNL Per RN no bleeding or line issues   Goal of Therapy:  Heparin level 0.3-0.7 units/ml Monitor platelets by anticoagulation protocol: Yes   Plan:  Hold heparin drip for 1 hour then decrease drip to 1350 units/hr - Heparin level in ~ 6 hours  - Monitor patient for s/s of bleeding and CBC while on heparin   14/08/2019 RPh 05/31/2020, 1:32 AM

## 2020-05-31 NOTE — Progress Notes (Signed)
Pharmacy - IV heparin  Assessment:    Please see note from Keturah Barre, PharmD earlier today for full details.  Briefly, 49 y.o. female on IV heparin for new saddle PE.   While awaiting next heparin level, decision made to use TPA for PE (100 mg over 2 hr)  Heparin paused for TPA infusion; per protocol checking aPTT 30 min after infusion complete and q2 hr until aPTT < 80 sec  1st aPTT 84 sec  No bleeding or infusion issues per RN   Plan:   Continue to hold IV heparin  Recheck aPTT in 2 hr (2245)  Bernadene Person, PharmD, BCPS [phone] 05/31/2020, 2:40 PM

## 2020-05-31 NOTE — Progress Notes (Signed)
ANTICOAGULATION CONSULT NOTE  Pharmacy Consult for Heparin  Indication: pulmonary embolus  Allergies  Allergen Reactions  . Sulfa Antibiotics Rash    Patient Measurements: Height: 5\' 5"  (165.1 cm) Weight: 134.4 kg (296 lb 4.8 oz) IBW/kg (Calculated) : 57 Heparin Dosing Weight: 90 kg  Vital Signs: Temp: 98.3 F (36.8 C) (12/03 0800) Temp Source: Oral (12/03 0800) BP: 137/111 (12/03 0800) Pulse Rate: 90 (12/03 0800)  Labs: Recent Labs    05/30/20 1520 05/30/20 1520 05/30/20 1721 05/31/20 0042 05/31/20 0659 05/31/20 0926  HGB 14.5   < > 16.0* 14.0  --   --   HCT 44.1  --  49.4* 45.0  --   --   PLT 216  --  193 200  --   --   HEPARINUNFRC  --   --   --  1.03* 0.82* 0.78*  CREATININE 1.17*  --   --  1.20*  --   --   TROPONINIHS 1,550*  --  1,266*  --   --   --    < > = values in this interval not displayed.    Estimated Creatinine Clearance: 80.5 mL/min (A) (by C-G formula based on SCr of 1.2 mg/dL (H)).   Medical History: Past Medical History:  Diagnosis Date  . Cellulitis and abscess     Medications:  Scheduled:  . Chlorhexidine Gluconate Cloth  6 each Topical Daily  . mouth rinse  15 mL Mouth Rinse BID  . sodium chloride flush  3 mL Intravenous Q12H    Assessment: Patient is a 49 year old female presenting with SOB. CTA revealed acute bilateral saddle PE with right heart strain. Pharmacy consulted to dose/monitor heparin. Pt not on anticoagulants PTA.  Today, 05/31/20  CBC: WNL  SCr 1.2, CrCl ~80 mL/min  HL = 0.82 was drawn too early. Repeat HL = 0.78 remains supratherapeutic despite decreasing heparin infusion to 1350 units/hr  Confirmed with RN that heparin infusing at correct rate. No signs of bleeding. Lab obtained via peripheral stick.  Goal of Therapy:  Heparin level 0.3-0.7 units/ml Monitor platelets by anticoagulation protocol: Yes   Plan:   Decrease heparin infusion to 1250 units/hr  Check HL in 6 hours  CBC, HL daily  Monitor  for signs of bleeding  14/03/21, PharmD 05/31/20 10:23 AM

## 2020-05-31 NOTE — Consult Note (Signed)
NAME:  [name], MRN:  [id number], DOB:  06-14-1971, LOS: 1 ADMISSION DATE:  05/30/2020, CONSULTATION DATE:  12/3 REFERRING MD:  patel , CHIEF COMPLAINT:  PE  Brief History   49 year old morbidly obese female admitted 12/2 with submassive pulmonary embolus and evidence of right heart strain by echocardiogram and elevated troponins Simplified PESI score 0; 47 for regular PESI=very low risk   History of present illness   49 year old never smoker.  Has history of morbid obesity.  Presented to the ER at [facility] on 12/2 for evaluation of shortness of breath.  This is been ongoing for approximately 4-week.  She initially felt this was related to seasonal allergies, approximately 2 weeks prior to presentation was treated for such with Medrol Dosepak and albuterol inhaler but had no significant improvement.  On 12/2 had significant worsening shortness of breath, so much to the point that she was short of breath at rest and because of this she presented for evaluation.A CT of chest was obtained this showed a large centrally obstructing pulmonary emboli with saddle configuration with near occlusive emboli extending into the upper and lower pulmonary arteries bilaterally there was evidence of right heart strain.  She had been given aspirin, IV Lasix, and started on IV heparin all in the emergency room and then later transferred to [facility] for admission and further care.  She later underwent echocardiogram on 12/3 this demonstrated intact left ventricular EF of 60 to 65% there was grade 2 diastolic dysfunction positive McConnell sign consistent with pulmonary emboli and there was severe right ventricular dysfunction with reduced systolic function because of this pulmonary asked to see. At time of consultation: Patient on room air with pulse oximetry 93 to 94%.  Heart rate 90s currently being maintained on heparin infusion last troponin was elevated at 876 this reflects a downward trend from  1550 obtained on day of admission  She tells me that over the last 6 months she is really had no shortness of breath until about 2 to 3 weeks ago.  She started having progressive shortness of breath with exertion and a dry cough.  She denied chest pain or leg pain or swelling.  She denies any periods of immobility.  No recent surgery.  No history of GI bleeding, no malignancy no history of stroke.  Her daughter says that she snores heavily at night though she has never been diagnosed with sleep apnea.  She has never been given a diagnosis of any pulmonary or cardiac abnormality in the past.  In July 2021 she said she felt perfectly healthy and was not limited by shortness of breath and was fairly active.  Past Medical History  Morbid obesity.  Prior cellulitis.  Prior abdominal hysterectomy. Significant Hospital Events   12/2 admitted and started on IV heparin   Consults:  Pulm  Procedures:   Significant Diagnostic Tests:  CT chest 12/3: large central obstructing pulmonary embolus with saddle configuration, image 33/4. Near occlusive emboli are identified extending into the right upper lobe, right lower lobe, left upper lobe, and left lower lobe pulmonary arteries. There is CT evidence of right heart strain (RV/LV Ratio = 1.2). ECHO 12/3: 1. Left ventricular ejection fraction, by estimation, is 60 to 65%. The  left ventricle has normal function. The left ventricle has no regional wall motion abnormalities. Left ventricular diastolic parameters are  consistent with Grade II diastolic  dysfunction (pseudonormalization).  2. + McConnell's sign - c/w pulmonary embolus. Right ventricular systolic  function is severely reduced. The right ventricular size is severely enlarged. There is severely elevated  pulmonary artery systolic pressure. The estimated right ventricular systolic pressure is 77.7 mmHg.3. Right atrial size was mildly dilated. 4. The mitral valve is normal in structure. No evidence of  mitral valve regurgitation. No evidence of mitral stenosis. 5. The aortic valve is normal in structure. Aortic valve regurgitation is not visualized. No aortic stenosis is present. 6. There is a large saddle pulmonary embolus at the bifurcation of the main pulmonary artery.  Micro Data:    Antimicrobials:    Interim history/subjective:    Objective   Blood pressure (Abnormal) 149/84, pulse 96, temperature 98.3 F (36.8 C), temperature source Oral, resp. rate 17, height 5\' 5"  (1.651 m), weight 134.4 kg, SpO2 94 %.        Intake/Output Summary (Last 24 hours) at 05/31/2020 1554 Last data filed at 05/31/2020 1500 Gross per 24 hour  Intake 350.8 ml  Output 1300 ml  Net -949.2 ml   Filed Weights   05/30/20 1256 05/30/20 1937 05/31/20 0500  Weight: 136.1 kg 134.4 kg 134.4 kg    Examination: General:  Resting comfortably in bed HENT: NCAT OP clear PULM: CTA B, normal effort CV: RRR, loud P2, no mgr GI: BS+, soft, nontender MSK: normal bulk and tone Neuro: awake, alert, no distress, MAEW   Resolved Hospital Problem list     Assessment & Plan:  Acute pulmonary embolism: Submassive, high risk however because elevated troponin, elevated PA pressure.  Currently not in shock, intermediate high risk by European cardiology criteria.  Risk of bad outcome over the next 30 days relatively high, likely 10 to 15%.  Given the elevated PA pressure of 77 seen on echo I suspect there is some other underlying causes of pulmonary hypertension such as poorly treated sleep apnea though she denies any previous diagnosis.  This also makes me feel hesitant about performing any sort of intervention which would involve passing a wire or catheter through her right ventricle.  Would prefer to decompress her right ventricle with IV TPA initially.  We discussed this at length today, I quoted a risk of severe bleeding of 1 to 3%.  Fortunately she has no major or minor risk factors as she has never had a history of  stroke, no recent GI bleeding, no recent surgeries, does not take blood thinners, and has no history of malignancy. Stay in ICU Telemetry monitoring IV TPA per protocol Heparin after protocol Repeat assessment of right ventricle over weekend: Either echocardiogram or CT angiogram If no improvement in right ventricle function or if her situation worsens from a hemodynamic standpoint then would consider catheter directed thrombectomy She understands the risk of TPA, questions answered. She consents to proceed with care  Discussed situation with interventional radiology who agrees with this plan of care  Best practice (evaluated daily)   Per [facility]  Labs   CBC: Recent Labs  Lab 05/30/20 1520 05/30/20 1721 05/31/20 0042  WBC 6.4 7.0 6.7  NEUTROABS 3.4  --   --   HGB 14.5 16.0* 14.0  HCT 44.1 49.4* 45.0  MCV 89.8 90.3 93.9  PLT 216 193 200    Basic Metabolic Panel: Recent Labs  Lab 05/30/20 1520 05/31/20 0042  NA 138 136  K 4.1 3.7  CL 106 107  CO2 22 17*  GLUCOSE 143* 200*  BUN 11 11  CREATININE 1.17* 1.20*  CALCIUM 8.9 8.8*   GFR: Estimated Creatinine Clearance: 80.5 mL/min (  A) (by C-G formula based on SCr of 1.2 mg/dL (H)). Recent Labs  Lab 05/30/20 1520 05/30/20 1721 05/31/20 0042  WBC 6.4 7.0 6.7    Liver Function Tests: Recent Labs  Lab 05/30/20 1520  AST 64*  ALT 96*  ALKPHOS 97  BILITOT 1.2  PROT 7.3  ALBUMIN 3.6   No results for input(s): LIPASE, AMYLASE in the last 168 hours. No results for input(s): AMMONIA in the last 168 hours.  ABG No results found for: PHART, PCO2ART, PO2ART, HCO3, TCO2, ACIDBASEDEF, O2SAT   Coagulation Profile: No results for input(s): INR, PROTIME in the last 168 hours.  Cardiac Enzymes: No results for input(s): CKTOTAL, CKMB, CKMBINDEX, TROPONINI in the last 168 hours.  HbA1C: No results found for: HGBA1C  CBG: No results for input(s): GLUCAP in the last 168 hours.  Review of Systems:   Gen: Denies fever,  chills, weight change, fatigue, night sweats HEENT: Denies blurred vision, double vision, hearing loss, tinnitus, sinus congestion, rhinorrhea, sore throat, neck stiffness, dysphagia PULM:per HPI CV: Denies chest pain, edema, orthopnea, paroxysmal nocturnal dyspnea, palpitations GI: Denies abdominal pain, nausea, vomiting, diarrhea, hematochezia, melena, constipation, change in bowel habits GU: Denies dysuria, hematuria, polyuria, oliguria, urethral discharge Endocrine: Denies hot or cold intolerance, polyuria, polyphagia or appetite change Derm: Denies rash, dry skin, scaling or peeling skin change Heme: Denies easy bruising, bleeding, bleeding gums Neuro: Denies headache, numbness, weakness, slurred speech, loss of memory or consciousness   Past Medical History  She,  has a past medical history of Cellulitis and abscess.   Surgical History    Past Surgical History:  Procedure Laterality Date  . ABDOMINAL HYSTERECTOMY    . CHOLECYSTECTOMY    . KNEE SURGERY       Social History   reports that she has never smoked. She does not have any smokeless tobacco history on file. She reports current alcohol use. She reports that she does not use drugs.   Family History   Her family history includes Diabetes in her mother.   Allergies Allergies  Allergen Reactions  . Sulfa Antibiotics Rash     Home Medications  Prior to Admission medications   Medication Sig Start Date End Date Taking? Authorizing Provider  Magnesium 250 MG TABS Take 1 tablet by mouth daily.   Yes [provider]  Multiple Vitamin (MULTIVITAMIN WITH MINERALS) TABS tablet Take 1 tablet by mouth daily.   Yes [provider]  fluticasone (FLONASE) 50 MCG/ACT nasal spray Place 1 spray into both nostrils daily. Patient not taking: Reported on 05/30/2020 12/15/15   Ward, Chase Picket, PA-C     Critical care time: 40 minutes    Heber Wiota, MD [facility] Pager: [phone] Cell: [phone] If no  response, call [phone]

## 2020-05-31 NOTE — Progress Notes (Signed)
eLink Physician-Brief Progress Note Patient Name: [name] DOB: Nov 15, 1970 MRN: [id number]   Date of Service  05/31/2020  HPI/Events of Note  Elevated blood pressue  eICU Interventions  PRN Hydralazine ordered.        [name] [name] 05/31/2020, 11:18 PM

## 2020-06-01 DIAGNOSIS — I2692 Saddle embolus of pulmonary artery without acute cor pulmonale: Secondary | ICD-10-CM | POA: Diagnosis not present

## 2020-06-01 DIAGNOSIS — R778 Other specified abnormalities of plasma proteins: Secondary | ICD-10-CM | POA: Diagnosis not present

## 2020-06-01 LAB — CBC
HCT: 39.1 % (ref 36.0–46.0)
Hemoglobin: 12.7 g/dL (ref 12.0–15.0)
MCH: 29.7 pg (ref 26.0–34.0)
MCHC: 32.5 g/dL (ref 30.0–36.0)
MCV: 91.4 fL (ref 80.0–100.0)
Platelets: 162 10*3/uL (ref 150–400)
RBC: 4.28 MIL/uL (ref 3.87–5.11)
RDW: 14.6 % (ref 11.5–15.5)
WBC: 6.2 10*3/uL (ref 4.0–10.5)
nRBC: 0 % (ref 0.0–0.2)

## 2020-06-01 LAB — BASIC METABOLIC PANEL
Anion gap: 11 (ref 5–15)
BUN: 15 mg/dL (ref 6–20)
CO2: 21 mmol/L — ABNORMAL LOW (ref 22–32)
Calcium: 8.5 mg/dL — ABNORMAL LOW (ref 8.9–10.3)
Chloride: 105 mmol/L (ref 98–111)
Creatinine, Ser: 1.1 mg/dL — ABNORMAL HIGH (ref 0.44–1.00)
GFR, Estimated: >60 mL/min (ref >60)
Glucose, Bld: 131 mg/dL — ABNORMAL HIGH (ref 70–99)
Potassium: 3.3 mmol/L — ABNORMAL LOW (ref 3.5–5.1)
Sodium: 137 mmol/L (ref 135–145)

## 2020-06-01 LAB — HEPARIN LEVEL (UNFRACTIONATED)
Heparin Unfractionated: 0.47 IU/mL (ref 0.30–0.70)
Heparin Unfractionated: 0.47 IU/mL (ref 0.30–0.70)

## 2020-06-01 LAB — ANTITHROMBIN III: AntiThromb III Func: 88 % (ref 75–120)

## 2020-06-01 MED ORDER — HEPARIN (PORCINE) 25000 UT/250ML-% IV SOLN
1250.0000 [IU]/h | INTRAVENOUS | Status: AC
  Administered 2020-06-01 – 2020-06-03 (×3): 1250 [IU]/h via INTRAVENOUS
  Filled 2020-06-01 (×4): qty 250

## 2020-06-01 MED ORDER — POTASSIUM CHLORIDE CRYS ER 20 MEQ PO TBCR
40.0000 meq | EXTENDED_RELEASE_TABLET | Freq: Once | ORAL | Status: AC
  Administered 2020-06-01: 40 meq via ORAL
  Filled 2020-06-01: qty 2

## 2020-06-01 MED ORDER — HYDROCHLOROTHIAZIDE 25 MG PO TABS
25.0000 mg | ORAL_TABLET | Freq: Every day | ORAL | Status: DC
  Administered 2020-06-01 – 2020-06-04 (×4): 25 mg via ORAL
  Filled 2020-06-01 (×4): qty 1

## 2020-06-01 NOTE — Progress Notes (Signed)
Pharmacy - IV heparin  Assessment:    Please see note from Keturah Barre, PharmD earlier today for full details.  Briefly, 49 y.o. female on IV heparin for new saddle PE.   While awaiting next heparin level, decision made to use TPA for PE (100 mg over 2 hr)  Heparin paused for TPA infusion; per protocol checking aPTT 30 min after infusion complete and q2 hr until aPTT < 80 sec  aPTT @ 2245 = 52 sec  Some oozing at IV site per RN but improved  Plan:   Resume IV heparin @ 1250 units/hr   Check heparin level in 8h  Daily  Heparin level & CBC while on heparin  Junita Push, PharmD, BCPS 06/01/2020, 12:02 AM

## 2020-06-01 NOTE — Progress Notes (Addendum)
NAME:  [name], MRN:  [id number], DOB:  21-May-1971, LOS: 2 ADMISSION DATE:  05/30/2020, CONSULTATION DATE:  12/3 REFERRING MD:  Allena Katz, CHIEF COMPLAINT:  PE   Brief History   49 y/o female admitted 12/2 with submassive PE and R heart strain by echo and elevated troponin.  Past Medical History  Morbid obesity Prior cellulitis Prior abdominal hysterectomy  Significant Hospital Events   12/2 admission, started IV heparin 12/3 IV TPA  Consults:  PCCM  Procedures:    Significant Diagnostic Tests:  CT chest 12/3: large central obstructing pulmonary embolus with saddle configuration, image 33/4. Near occlusive emboli are identified extending into the right upper lobe, right lower lobe, left upper lobe, and left lower lobe pulmonary arteries. There is CT evidence of right heart strain (RV/LV Ratio = 1.2). ECHO 12/3: 1. Left ventricular ejection fraction, by estimation, is 60 to 65%. The  left ventricle has normal function. The left ventricle has no regional wall motion abnormalities. Left ventricular diastolic parameters are  consistent with Grade II diastolic  dysfunction (pseudonormalization).  2. + McConnell's sign - c/w pulmonary embolus. Right ventricular systolic function is severely reduced. The right ventricular size is severely enlarged. There is severely elevated  pulmonary artery systolic pressure. The estimated right ventricular systolic pressure is 77.7 mmHg.3. Right atrial size was mildly dilated. 4. The mitral valve is normal in structure. No evidence of mitral valve regurgitation. No evidence of mitral stenosis. 5. The aortic valve is normal in structure. Aortic valve regurgitation is not visualized. No aortic stenosis is present. 6. There is a large saddle pulmonary embolus at the bifurcation of the main pulmonary artery.  12/4 Lower extremity vascular ultrasound> positive for DVT in popliteal vein on left  Micro Data:    Antimicrobials:     Interim  history/subjective:  Feels better this morning Doesn't feel dyspnea with talking Less cough  Objective   Blood pressure 125/81, pulse 82, temperature 97.9 F (36.6 C), temperature source Oral, resp. rate 15, height 5\' 5"  (1.651 m), weight 136 kg, SpO2 97 %.        Intake/Output Summary (Last 24 hours) at 06/01/2020 0800 Last data filed at 06/01/2020 0600 Gross per 24 hour  Intake 406.75 ml  Output 500 ml  Net -93.25 ml   Filed Weights   05/30/20 1937 05/31/20 0500 06/01/20 0500  Weight: 134.4 kg 134.4 kg 136 kg    Examination: General:  Morbidly obese, resting comfortably in bed HENT: NCAT OP clear PULM: CTA B, normal effort CV: RRR, no mgr GI: BS+, soft, nontender MSK: normal bulk and tone Neuro: awake, alert, no distress, MAEW   Resolved Hospital Problem list     Assessment & Plan:  Remains critically ill due to Acute pulmonary embolism with shock (elevated lactic acid) and severe clot burden with very high PA pressure on echo.  S/p IV TPA on 12/3, feeling better on 12/4 Stay in ICU Up ad lib Tele Heparin Repeat echo in 12/5, if pressure significantly improved then will continue heparin alone; if pressure still elevated will discuss risks and benefits of mechanical thrombectomy   Pulmonary hypertension: doubt this is all acute, suspect underlying pulmonary arterial hypertension of some sort.  Echo without LVH or LA enlargement so doubt pulmonary venous hypertension Will need RHC at some point depending on repeat echo findings   Best practice (evaluated daily)   Per [facility]  Labs   CBC: Recent Labs  Lab 05/30/20 1520 05/30/20 1520 05/30/20 1721 05/31/20 0042  05/31/20 1730 05/31/20 1849 06/01/20 0500  WBC 6.4   < > 7.0 6.7 7.2 6.3 6.2  NEUTROABS 3.4  --   --   --   --   --   --   HGB 14.5   < > 16.0* 14.0 14.1 12.9 12.7  HCT 44.1   < > 49.4* 45.0 44.3 40.4 39.1  MCV 89.8   < > 90.3 93.9 91.5 92.2 91.4  PLT 216   < > 193 200 216 203 162   < > = values in  this interval not displayed.    Basic Metabolic Panel: Recent Labs  Lab 05/30/20 1520 05/31/20 0042 06/01/20 0500  NA 138 136 137  K 4.1 3.7 3.3*  CL 106 107 105  CO2 22 17* 21*  GLUCOSE 143* 200* 131*  BUN 11 11 15   CREATININE 1.17* 1.20* 1.10*  CALCIUM 8.9 8.8* 8.5*   GFR: Estimated Creatinine Clearance: 88.4 mL/min (A) (by C-G formula based on SCr of 1.1 mg/dL (H)). Recent Labs  Lab 05/31/20 0042 05/31/20 1721 05/31/20 1730 05/31/20 1849 06/01/20 0500  WBC 6.7  --  7.2 6.3 6.2  LATICACIDVEN  --  2.9*  --   --   --     Liver Function Tests: Recent Labs  Lab 05/30/20 1520  AST 64*  ALT 96*  ALKPHOS 97  BILITOT 1.2  PROT 7.3  ALBUMIN 3.6   No results for input(s): LIPASE, AMYLASE in the last 168 hours. No results for input(s): AMMONIA in the last 168 hours.  ABG    Component Value Date/Time   PHART 7.459 (H) 05/31/2020 1643   PCO2ART 30.5 (L) 05/31/2020 1643   PO2ART 62.1 (L) 05/31/2020 1643   HCO3 21.3 05/31/2020 1643   ACIDBASEDEF 1.1 05/31/2020 1643   O2SAT 90.4 05/31/2020 1643     Coagulation Profile: Recent Labs  Lab 05/31/20 1730 05/31/20 1849  INR 1.1 1.1    Cardiac Enzymes: No results for input(s): CKTOTAL, CKMB, CKMBINDEX, TROPONINI in the last 168 hours.  HbA1C: No results found for: HGBA1C  CBG: No results for input(s): GLUCAP in the last 168 hours.   Critical care time: 35 minutes     14/03/21, MD [place] [facility] Pager: [phone] Cell: [phone] If no response, call [phone]

## 2020-06-01 NOTE — Progress Notes (Signed)
ANTICOAGULATION CONSULT NOTE  Pharmacy Consult for Heparin  Indication: pulmonary embolus  Allergies  Allergen Reactions  . Sulfa Antibiotics Rash    Patient Measurements: Height: 5\' 5"  (165.1 cm) Weight: 136 kg (299 lb 13.2 oz) IBW/kg (Calculated) : 57 Heparin Dosing Weight: 90 kg  Vital Signs: Temp: 97.9 F (36.6 C) (12/04 0329) Temp Source: Oral (12/04 0329) BP: 125/81 (12/04 0630) Pulse Rate: 82 (12/04 0630)  Labs: Recent Labs    05/30/20 1520 05/30/20 1520 05/30/20 1721 05/30/20 1721 05/31/20 0042 05/31/20 0042 05/31/20 0659 05/31/20 0926 05/31/20 1730 05/31/20 1730 05/31/20 1849 05/31/20 2045 05/31/20 2245 06/01/20 0500 06/01/20 0910  HGB 14.5   < > 16.0*   < > 14.0   < >  --   --  14.1   < > 12.9  --   --  12.7  --   HCT 44.1   < > 49.4*   < > 45.0   < >  --   --  44.3  --  40.4  --   --  39.1  --   PLT 216   < > 193   < > 200   < >  --   --  216  --  203  --   --  162  --   APTT  --   --   --   --   --   --   --   --  81*   < > 36 84* 52*  --   --   LABPROT  --   --   --   --   --   --   --   --  14.2  --  13.7  --   --   --   --   INR  --   --   --   --   --   --   --   --  1.1  --  1.1  --   --   --   --   HEPARINUNFRC  --   --   --   --  1.03*   < > 0.82* 0.78*  --   --   --   --   --   --  0.47  CREATININE 1.17*  --   --   --  1.20*  --   --   --   --   --   --   --   --  1.10*  --   TROPONINIHS 1,550*  --  1,266*  --   --   --   --  876*  --   --   --   --   --   --   --    < > = values in this interval not displayed.    Estimated Creatinine Clearance: 88.4 mL/min (A) (by C-G formula based on SCr of 1.1 mg/dL (H)).   Medical History: Past Medical History:  Diagnosis Date  . Cellulitis and abscess     Medications:  Scheduled:  . Chlorhexidine Gluconate Cloth  6 each Topical Daily  . mouth rinse  15 mL Mouth Rinse BID  . potassium chloride  40 mEq Oral Once  . sodium chloride flush  10-40 mL Intracatheter Q12H  . sodium chloride flush  3  mL Intravenous Q12H    Assessment: Patient is a 49 year old female presenting with SOB. CTA revealed acute bilateral saddle PE with right heart strain. Pharmacy consulted  to dose/monitor heparin.  Pt not on anticoagulants PTA.  Significant Events: -12/3: alteplase 100 mg IV once over 2 hours given for PE Heparin drip resumed once aPTT < 80 seconds post-tPA  Today, 06/01/20  HL = 0.47 is therapeutic on heparin infusion of 1250 units/hr  Confirmed with RN that heparin infusing at correct rate. Minimal oozing from IV site reported, but no overt signs of bleeding. No melena.   CBC: Stable & WNL s/p TPA  Goal of Therapy:  HL 0.3-0.5 for 24 hrs post-TPA, then resume goal HL of 0.3-0.7 Monitor platelets by anticoagulation protocol: Yes   Plan:   Continue heparin infusion at current rate of 1250 units/hr  Check confirmatory HL in 6 hours  HL, CBC daily while on heparin infusion  Monitor for signs of bleeding  Cindi Carbon, PharmD 06/01/20 9:47 AM

## 2020-06-01 NOTE — Progress Notes (Signed)
ANTICOAGULATION CONSULT NOTE  Pharmacy Consult for Heparin  Indication: pulmonary embolus  Allergies  Allergen Reactions  . Sulfa Antibiotics Rash    Patient Measurements: Height: 5\' 5"  (165.1 cm) Weight: 136 kg (299 lb 13.2 oz) IBW/kg (Calculated) : 57 Heparin Dosing Weight: 90 kg  Vital Signs: Temp: 97.5 F (36.4 C) (12/04 0800) Temp Source: Oral (12/04 0800) BP: 162/88 (12/04 1200) Pulse Rate: 76 (12/04 1200)  Labs: Recent Labs    05/30/20 1520 05/30/20 1520 05/30/20 1721 05/30/20 1721 05/31/20 0042 05/31/20 0042 05/31/20 0659 05/31/20 0926 05/31/20 1730 05/31/20 1730 05/31/20 1849 05/31/20 2045 05/31/20 2245 06/01/20 0500 06/01/20 0910 06/01/20 1528  HGB 14.5   < > 16.0*   < > 14.0   < >  --   --  14.1   < > 12.9  --   --  12.7  --   --   HCT 44.1   < > 49.4*   < > 45.0   < >  --   --  44.3  --  40.4  --   --  39.1  --   --   PLT 216   < > 193   < > 200   < >  --   --  216  --  203  --   --  162  --   --   APTT  --   --   --   --   --   --   --   --  81*   < > 36 84* 52*  --   --   --   LABPROT  --   --   --   --   --   --   --   --  14.2  --  13.7  --   --   --   --   --   INR  --   --   --   --   --   --   --   --  1.1  --  1.1  --   --   --   --   --   HEPARINUNFRC  --   --   --   --  1.03*  --    < > 0.78*  --   --   --   --   --   --  0.47 0.47  CREATININE 1.17*  --   --   --  1.20*  --   --   --   --   --   --   --   --  1.10*  --   --   TROPONINIHS 1,550*  --  1,266*  --   --   --   --  876*  --   --   --   --   --   --   --   --    < > = values in this interval not displayed.    Estimated Creatinine Clearance: 88.4 mL/min (A) (by C-G formula based on SCr of 1.1 mg/dL (H)).   Medical History:  Assessment: Patient is a 49 year old female presenting with SOB. CTA revealed acute bilateral saddle PE with right heart strain. Pharmacy consulted to dose/monitor heparin.  Pt not on anticoagulants PTA.  Significant Events: -12/3: alteplase 100 mg IV  once over 2 hours given for PE Heparin drip resumed once aPTT < 80 seconds post-tPA  Today, 06/01/20  Confirmatory HL =  0.47 at 1600.  remains therapeutic on heparin infusion of 1250 units/hr  No bleeding reported  CBC: Stable & WNL s/p TPA  Goal of Therapy:  HL 0.3-0.5 for 24 hrs post-TPA- ends 12/4 at 2030, then resume goal HL of 0.3-0.7 Monitor platelets by anticoagulation protocol: Yes   Plan:   Continue heparin infusion at current rate of 1250 units/hr  HL, CBC daily while on heparin infusion  Monitor for signs of bleeding  Herby Abraham, Pharm.D 06/01/2020 4:12 PM

## 2020-06-01 NOTE — Progress Notes (Signed)
[facility] Progress Note  Patient: [name]    DJS:[id number]  DOA: 05/30/2020     Date of Service: the patient was seen and examined on 06/01/2020  Brief hospital course: No significant past medical history other than obesity.  Presents with complaints of 1 month of shortness of breath.  Found to have acute saddle PE. Currently plan is continue to treat PE under the guidance of PCCM.  Assessment and Plan: 1.  Acute saddle pulmonary embolism. Right heart strain. Elevated troponin. Acute hypoxic respiratory failure. Currently on room air CT scan shows evidence of large saddle pulmonary embolism. Initially started on IV heparin. Lower extremity Doppler also positive for DVT. Given the size of the PE, elevation of the troponin as well as severe RV dysfunction seen on the echocardiogram PCCM was consulted. Highly appreciate their assistance. Understandably patient remains at high risk for catheter guided thrombolysis given her poor RV. Tolerated TPA very well. Repeat echocardiogram on 06/02/2020. Continue to monitor in the stepdown unit.  2.  Morbid obesity Body mass index is 49.89 kg/m.  Placing the patient at high risk for poor outcomes. We will monitor. Patient will benefit from outpatient dietary consultation.  3.  Hypercoagulability Etiology of patient's PE is not clear. She does not have any immobilization history not she has any medication history that can increase risk for PE. 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 We will check hypercoagulable panel Primarily looking for genetic markers rather than protein levels.  4.  Essential hypertension pretension blood pressure elevated. Also has lower extremity edema. We will initiate hydralazine and monitor.  5.  Hypokalemia. Replacing.  Diet: Regular diet   Advance goals of care discussion: Full code  Family Communication: no family was present  at bedside, at the time of interview.   Disposition:  Status is: Inpatient  Remains inpatient appropriate because:Inpatient level of care appropriate due to severity of illness   Dispo: The patient is from: Home              Anticipated d/c is to: Home              Anticipated d/c date is: > 3 days              Patient currently is not medically stable to d/c.   Subjective: No nausea no vomiting.  Continues to have some shortness of breath although improving.  Cough improving as well.  No bleeding reported.  Physical Exam:  General: Appear in mild distress, no Rash; Oral Mucosa Clear, moist. no Abnormal Neck Mass Or lumps, Conjunctiva normal  Cardiovascular: S1 and S2 Present, no Murmur, Respiratory: increased respiratory effort, Bilateral Air entry present and bilateral  Crackles, no wheezes Abdomen: Bowel Sound present, Soft and no tenderness Extremities: bilateral  Pedal edema Neurology: alert and oriented to time, place, and person affect appropriate. no new focal deficit Gait not checked due to patient safety concerns  Vitals:   06/01/20 1000 06/01/20 1100 06/01/20 1200 06/01/20 1600  BP: (!) 159/82 (!) 161/73 (!) 162/88 (!) 173/75  Pulse: 81 75 76 96  Resp: 14 18 15  (!) 21  Temp:    (!) 97.3 F (36.3 C)  TempSrc:    Oral  SpO2: 99% 99% 98% 97%  Weight:      Height:        Intake/Output Summary (Last 24 hours) at 06/01/2020 2009 Last data filed at 06/01/2020 1700  Gross per 24 hour  Intake 260.53 ml  Output 2000 ml  Net -1739.47 ml   Filed Weights   05/30/20 1937 05/31/20 0500 06/01/20 0500  Weight: 134.4 kg 134.4 kg 136 kg    Data Reviewed: I have personally reviewed and interpreted daily labs, tele strips, imagings as discussed above. I reviewed all nursing notes, pharmacy notes, vitals, pertinent old records I have discussed plan of care as described above with RN and patient/family.  CBC: Recent Labs  Lab 05/30/20 1520 05/30/20 1520 05/30/20 1721  05/31/20 0042 05/31/20 1730 05/31/20 1849 06/01/20 0500  WBC 6.4   < > 7.0 6.7 7.2 6.3 6.2  NEUTROABS 3.4  --   --   --   --   --   --   HGB 14.5   < > 16.0* 14.0 14.1 12.9 12.7  HCT 44.1   < > 49.4* 45.0 44.3 40.4 39.1  MCV 89.8   < > 90.3 93.9 91.5 92.2 91.4  PLT 216   < > 193 200 216 203 162   < > = values in this interval not displayed.   Basic Metabolic Panel: Recent Labs  Lab 05/30/20 1520 05/31/20 0042 06/01/20 0500  NA 138 136 137  K 4.1 3.7 3.3*  CL 106 107 105  CO2 22 17* 21*  GLUCOSE 143* 200* 131*  BUN 11 11 15   CREATININE 1.17* 1.20* 1.10*  CALCIUM 8.9 8.8* 8.5*    Studies: No results found.  Scheduled Meds: . Chlorhexidine Gluconate Cloth  6 each Topical Daily  . hydrochlorothiazide  25 mg Oral Daily  . mouth rinse  15 mL Mouth Rinse BID  . sodium chloride flush  10-40 mL Intracatheter Q12H  . sodium chloride flush  3 mL Intravenous Q12H   Continuous Infusions: . heparin 1,250 Units/hr (06/01/20 1256)   PRN Meds: acetaminophen **OR** acetaminophen, hydrALAZINE, ondansetron **OR** ondansetron (ZOFRAN) IV, sodium chloride flush  Time spent: 35 minutes  Author: 14/04/21, MD [facility] 06/01/2020 8:09 PM  To reach On-call, see care teams to locate the attending and reach out via [email or web address]. Between 7PM-7AM, please contact night-coverage If you still have difficulty reaching the attending provider, please page the [facility] (Director on Call) for [facility] on amion for assistance.

## 2020-06-02 ENCOUNTER — Inpatient Hospital Stay (HOSPITAL_COMMUNITY): Payer: Federal, State, Local not specified - PPO

## 2020-06-02 ENCOUNTER — Other Ambulatory Visit: Payer: Self-pay

## 2020-06-02 ENCOUNTER — Encounter (HOSPITAL_COMMUNITY): Payer: Self-pay | Admitting: Family Medicine

## 2020-06-02 DIAGNOSIS — I2692 Saddle embolus of pulmonary artery without acute cor pulmonale: Secondary | ICD-10-CM

## 2020-06-02 DIAGNOSIS — R778 Other specified abnormalities of plasma proteins: Secondary | ICD-10-CM | POA: Diagnosis not present

## 2020-06-02 LAB — BASIC METABOLIC PANEL
Anion gap: 9 (ref 5–15)
BUN: 11 mg/dL (ref 6–20)
CO2: 24 mmol/L (ref 22–32)
Calcium: 8.7 mg/dL — ABNORMAL LOW (ref 8.9–10.3)
Chloride: 104 mmol/L (ref 98–111)
Creatinine, Ser: 0.97 mg/dL (ref 0.44–1.00)
GFR, Estimated: >60 mL/min (ref >60)
Glucose, Bld: 133 mg/dL — ABNORMAL HIGH (ref 70–99)
Potassium: 3.7 mmol/L (ref 3.5–5.1)
Sodium: 137 mmol/L (ref 135–145)

## 2020-06-02 LAB — CBC
HCT: 40.1 % (ref 36.0–46.0)
Hemoglobin: 13 g/dL (ref 12.0–15.0)
MCH: 29.3 pg (ref 26.0–34.0)
MCHC: 32.4 g/dL (ref 30.0–36.0)
MCV: 90.5 fL (ref 80.0–100.0)
Platelets: 200 10*3/uL (ref 150–400)
RBC: 4.43 MIL/uL (ref 3.87–5.11)
RDW: 14.8 % (ref 11.5–15.5)
WBC: 5.6 10*3/uL (ref 4.0–10.5)
nRBC: 0 % (ref 0.0–0.2)

## 2020-06-02 LAB — BETA-2-GLYCOPROTEIN I ABS, IGG/M/A
Beta-2 Glyco I IgG: <9 GPI IgG units (ref 0–20)
Beta-2-Glycoprotein I IgA: <9 GPI IgA units (ref 0–25)
Beta-2-Glycoprotein I IgM: <9 GPI IgM units (ref 0–32)

## 2020-06-02 LAB — HEPARIN LEVEL (UNFRACTIONATED)
Heparin Unfractionated: 0.36 IU/mL (ref 0.30–0.70)
Heparin Unfractionated: 2.01 IU/mL — ABNORMAL HIGH (ref 0.30–0.70)
Heparin Unfractionated: 0.47 IU/mL (ref 0.30–0.70)

## 2020-06-02 LAB — ECHOCARDIOGRAM LIMITED
Height: 65 in
Weight: 4754.88 oz

## 2020-06-02 LAB — PROTEIN C, TOTAL: Protein C, Total: 85 % (ref 60–150)

## 2020-06-02 MED ORDER — METHYLPREDNISOLONE SODIUM SUCC 40 MG IJ SOLR: 40.0000 mg | Freq: Once | INTRAMUSCULAR | Status: DC

## 2020-06-02 MED ORDER — DIPHENHYDRAMINE HCL 25 MG PO CAPS: 25.0000 mg | ORAL_CAPSULE | Freq: Four times a day (QID) | ORAL | Status: DC | PRN

## 2020-06-02 NOTE — Progress Notes (Signed)
ANTICOAGULATION CONSULT NOTE  Pharmacy Consult for Heparin  Indication: pulmonary embolus  Allergies  Allergen Reactions  . Sulfa Antibiotics Rash    Patient Measurements: Height: 5\' 5"  (165.1 cm) Weight: 134.8 kg (297 lb 2.9 oz) IBW/kg (Calculated) : 57 Heparin Dosing Weight: 90 kg  Vital Signs: Temp: 99 F (37.2 C) (12/05 0346) Temp Source: Oral (12/05 0346) BP: 133/90 (12/05 0400) Pulse Rate: 87 (12/05 0400)  Labs: Recent Labs    05/30/20 1520 05/30/20 1520 05/30/20 1721 05/30/20 1721 05/31/20 0042 05/31/20 0042 05/31/20 0659 05/31/20 0926 05/31/20 0926 05/31/20 1730 05/31/20 1730 05/31/20 1849 05/31/20 1849 05/31/20 2045 05/31/20 2245 06/01/20 0500 06/01/20 0910 06/01/20 1528 06/02/20 0535  HGB 14.5   < > 16.0*   < > 14.0   < >  --   --   --  14.1   < > 12.9   < >  --   --  12.7  --   --  13.0  HCT 44.1   < > 49.4*   < > 45.0   < >  --   --   --  44.3   < > 40.4  --   --   --  39.1  --   --  40.1  PLT 216   < > 193   < > 200   < >  --   --   --  216   < > 203  --   --   --  162  --   --  200  APTT  --   --   --   --   --   --   --   --   --  81*   < > 36  --  84* 52*  --   --   --   --   LABPROT  --   --   --   --   --   --   --   --   --  14.2  --  13.7  --   --   --   --   --   --   --   INR  --   --   --   --   --   --   --   --   --  1.1  --  1.1  --   --   --   --   --   --   --   HEPARINUNFRC  --   --   --   --  1.03*  --    < > 0.78*   < >  --   --   --   --   --   --   --  0.47 0.47 0.36  CREATININE 1.17*  --   --    < > 1.20*  --   --   --   --   --   --   --   --   --   --  1.10*  --   --  0.97  TROPONINIHS 1,550*  --  1,266*  --   --   --   --  876*  --   --   --   --   --   --   --   --   --   --   --    < > = values in this interval not displayed.    Estimated Creatinine Clearance:  99.7 mL/min (by C-G formula based on SCr of 0.97 mg/dL).   Medical History:  Assessment: Patient is a 49 year old female presenting with SOB. CTA revealed  acute bilateral saddle PE with right heart strain. Pharmacy consulted to dose/monitor heparin.  Pt not on anticoagulants PTA.  Significant Events: -12/3: alteplase 100 mg IV once over 2 hours given for PE Heparin drip resumed once aPTT < 80 seconds post-tPA  Today, 06/02/20  HL = 0.36- remains therapeutic on heparin infusion of 1250 units/hr  No bleeding or infusion related issues reported by RN  CBC: Stable & WNL s/p TPA  Goal of Therapy:  HL 0.3-0.7 Monitor platelets by anticoagulation protocol: Yes   Plan:   Continue heparin infusion at current rate of 1250 units/hr  HL, CBC daily while on heparin infusion  Monitor for signs of bleeding  Junita Push, Pharm.D 06/02/2020 6:16 AM

## 2020-06-02 NOTE — Plan of Care (Signed)
Patient states she feels much better and cough has significantly decreased. Patient remains alert and oriented without evidence of distress.    Problem: Education: Goal: Knowledge of General Education information will improve Description: Including pain rating scale, medication(s)/side effects and non-pharmacologic comfort measures Outcome: Progressing   Problem: Health Behavior/Discharge Planning: Goal: Ability to manage health-related needs will improve Outcome: Progressing   Problem: Clinical Measurements: Goal: Ability to maintain clinical measurements within normal limits will improve Outcome: Progressing Goal: Will remain free from infection Outcome: Progressing Goal: Diagnostic test results will improve Outcome: Progressing Goal: Respiratory complications will improve Outcome: Progressing Goal: Cardiovascular complication will be avoided Outcome: Progressing   Problem: Activity: Goal: Risk for activity intolerance will decrease Outcome: Progressing   Problem: Nutrition: Goal: Adequate nutrition will be maintained Outcome: Progressing   Problem: Coping: Goal: Level of anxiety will decrease Outcome: Progressing   Problem: Elimination: Goal: Will not experience complications related to bowel motility Outcome: Progressing Goal: Will not experience complications related to urinary retention Outcome: Progressing   Problem: Pain Managment: Goal: General experience of comfort will improve Outcome: Progressing   Problem: Safety: Goal: Ability to remain free from injury will improve Outcome: Progressing   Problem: Skin Integrity: Goal: Risk for impaired skin integrity will decrease Outcome: Progressing

## 2020-06-02 NOTE — Progress Notes (Signed)
ANTICOAGULATION CONSULT NOTE  Pharmacy Consult for Heparin  Indication: pulmonary embolus  Allergies  Allergen Reactions  . Sulfa Antibiotics Rash    Patient Measurements: Height: 5\' 5"  (165.1 cm) Weight: 134.8 kg (297 lb 2.9 oz) IBW/kg (Calculated) : 57 Heparin Dosing Weight: 90 kg  Vital Signs: Temp: 98.4 F (36.9 C) (12/05 0900) Temp Source: Oral (12/05 0900) BP: 138/84 (12/05 1200) Pulse Rate: 85 (12/05 1200)  Labs: Recent Labs    05/31/20 0042 05/31/20 0042 05/31/20 0659 05/31/20 0926 05/31/20 1730 05/31/20 1730 05/31/20 1849 05/31/20 1849 05/31/20 2045 05/31/20 2245 06/01/20 0500 06/01/20 0910 06/01/20 1528 06/02/20 0535 06/02/20 1336  HGB 14.0   < >  --   --  14.1   < > 12.9   < >  --   --  12.7  --   --  13.0  --   HCT 45.0   < >  --   --  44.3   < > 40.4  --   --   --  39.1  --   --  40.1  --   PLT 200   < >  --   --  216   < > 203  --   --   --  162  --   --  200  --   APTT  --   --   --   --  81*   < > 36  --  84* 52*  --   --   --   --   --   LABPROT  --   --   --   --  14.2  --  13.7  --   --   --   --   --   --   --   --   INR  --   --   --   --  1.1  --  1.1  --   --   --   --   --   --   --   --   HEPARINUNFRC 1.03*  --    < > 0.78*  --   --   --   --   --   --   --    < > 0.47 0.36 2.01*  CREATININE 1.20*  --   --   --   --   --   --   --   --   --  1.10*  --   --  0.97  --   TROPONINIHS  --   --   --  876*  --   --   --   --   --   --   --   --   --   --   --    < > = values in this interval not displayed.    Estimated Creatinine Clearance: 99.7 mL/min (by C-G formula based on SCr of 0.97 mg/dL).   Assessment: Patient is a 49 year old female presenting with SOB. CTA revealed acute bilateral saddle PE with right heart strain. Pharmacy consulted to dose/monitor heparin.  Pt not on anticoagulants PTA.  Significant Events: -12/3: alteplase 100 mg IV once over 2 hours given for PE Heparin drip resumed once aPTT < 80 seconds post-tPA  Today,  06/02/20  1336 HL = 2.01- supra-therapeutic. Level drawn from same arm as heparin infusion  1716 Repeat HL drawn from opposite arm =  0.74 - therapeutic on heparin infusion of 1250  units/hr  No bleeding or infusion related issues reported by RN  CBC: Stable & WNL s/p TPA  Goal of Therapy:  HL 0.3-0.7 Monitor platelets by anticoagulation protocol: Yes   Plan:   Continue heparin infusion at current rate of 1250 units/hr  HL, CBC daily while on heparin infusion  Monitor for signs of bleeding  F/u for transition to oral agent  Herby Abraham, Pharm.D 06/02/2020 5:56 PM

## 2020-06-02 NOTE — Progress Notes (Signed)
[facility] Progress Note  Patient: [name]    TKZ:[id number]  DOA: 05/30/2020     Date of Service: the patient was seen and examined on 06/02/2020  Brief hospital course: No significant past medical history other than obesity.  Presents with complaints of 1 month of shortness of breath.  Found to have acute saddle PE. Currently plan is continue heparin and further work-up for RV failure.  Assessment and Plan: 1.  Acute saddle pulmonary embolism. Right heart strain. Elevated troponin. Acute hypoxic respiratory failure. Resolved Pulmonary hypertension Currently on room air CT scan shows evidence of large saddle pulmonary embolism. Initially started on IV heparin. Lower extremity Doppler also positive for DVT. Given the size of the PE, elevation of the troponin as well as severe RV dysfunction seen on the echocardiogram PCCM was consulted. Highly appreciate their assistance. Understandably patient remains at high risk for catheter guided thrombolysis given her poor RV. Tolerated TPA very well. Repeat echocardiogram on 06/02/2020.  Results currently pending. Per PCCM will need right heart cath in future. Continue to monitor in the stepdown unit.  2.  Morbid obesity Body mass index is 49.45 kg/m.  Placing the patient at high risk for poor outcomes. We will monitor. Patient will benefit from outpatient dietary consultation.  3.  Hypercoagulability Etiology of patient's PE is not clear. She does not have any immobilization history not she has any medication history that can increase risk for PE. 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 We will check hypercoagulable panel Primarily looking for genetic markers rather than protein levels.  4.  Essential hypertension  blood pressure elevated. Also has lower extremity edema. We will initiate hydralazine and monitor.  5.   Hypokalemia. Replacing.   6.  Needs sleep apnea assessment. Family member at bedside reports patient has been snoring and occasionally stops breathing. Patient denies having any complaints of early morning headaches, but reports daytime sleepiness. Likely partially responsible for patient's right heart failure as well. Will benefit from outpatient sleep study.  Diet: Cardiac diet  Advance goals of care discussion: Full code  Family Communication: family was present at bedside, at the time of interview.  The pt provided permission to discuss medical plan with the family. Opportunity was given to ask question and all questions were answered satisfactorily.   Disposition:  Status is: Inpatient  Remains inpatient appropriate because:Ongoing diagnostic testing needed not appropriate for outpatient work up  Dispo: The patient is from: Home              Anticipated d/c is to: Home              Anticipated d/c date is: 2 days              Patient currently is not medically stable to d/c.  Subjective: No nausea no vomiting.  No chest pain.  No abdominal pain.  No dizziness or lightheadedness.  Swelling in the leg is improving.  No bleeding reported.  Physical Exam:  General: Appear in mild distress, no Rash; Oral Mucosa Clear, moist. no Abnormal Neck Mass Or lumps, Conjunctiva normal  Cardiovascular: S1 and S2 Present, no Murmur, Respiratory: good respiratory effort, Bilateral Air entry present faint Crackles, no wheezes Abdomen: Bowel Sound present, Soft and no tenderness Extremities: trace Pedal edema Neurology: alert and oriented to time, place, and person affect appropriate. no new focal deficit Gait not checked due to patient  safety concerns  Vitals:   06/02/20 0800 06/02/20 0900 06/02/20 1000 06/02/20 1200  BP: 138/82  (!) 153/81 138/84  Pulse: 83 94 87 85  Resp: 16 20 16  (!) 21  Temp:  98.4 F (36.9 C)    TempSrc:  Oral    SpO2: 94% 99% 97% 96%  Weight:      Height:         Intake/Output Summary (Last 24 hours) at 06/02/2020 1654 Last data filed at 06/02/2020 1300 Gross per 24 hour  Intake 616.01 ml  Output 3600 ml  Net -2983.99 ml   Filed Weights   05/31/20 0500 06/01/20 0500 06/02/20 0500  Weight: 134.4 kg 136 kg 134.8 kg    Data Reviewed: I have personally reviewed and interpreted daily labs, tele strips, imagings as discussed above. I reviewed all nursing notes, pharmacy notes, vitals, pertinent old records I have discussed plan of care as described above with RN and patient/family.  CBC: Recent Labs  Lab 05/30/20 1520 05/30/20 1721 05/31/20 0042 05/31/20 1730 05/31/20 1849 06/01/20 0500 06/02/20 0535  WBC 6.4   < > 6.7 7.2 6.3 6.2 5.6  NEUTROABS 3.4  --   --   --   --   --   --   HGB 14.5   < > 14.0 14.1 12.9 12.7 13.0  HCT 44.1   < > 45.0 44.3 40.4 39.1 40.1  MCV 89.8   < > 93.9 91.5 92.2 91.4 90.5  PLT 216   < > 200 216 203 162 200   < > = values in this interval not displayed.   Basic Metabolic Panel: Recent Labs  Lab 05/30/20 1520 05/31/20 0042 06/01/20 0500 06/02/20 0535  NA 138 136 137 137  K 4.1 3.7 3.3* 3.7  CL 106 107 105 104  CO2 22 17* 21* 24  GLUCOSE 143* 200* 131* 133*  BUN 11 11 15 11   CREATININE 1.17* 1.20* 1.10* 0.97  CALCIUM 8.9 8.8* 8.5* 8.7*    Studies: No results found.  Scheduled Meds: . Chlorhexidine Gluconate Cloth  6 each Topical Daily  . hydrochlorothiazide  25 mg Oral Daily  . mouth rinse  15 mL Mouth Rinse BID  . sodium chloride flush  10-40 mL Intracatheter Q12H  . sodium chloride flush  3 mL Intravenous Q12H   Continuous Infusions: . heparin 1,250 Units/hr (06/02/20 1209)   PRN Meds: acetaminophen **OR** acetaminophen, hydrALAZINE, ondansetron **OR** ondansetron (ZOFRAN) IV, sodium chloride flush  Time spent: 35 minutes  Author: , MD [facility] 06/02/2020 4:54 PM  To reach On-call, see care teams to locate the attending and reach out via  [email or web address]. Between 7PM-7AM, please contact night-coverage If you still have difficulty reaching the attending provider, please page the [facility] (Director on Call) for [facility] on amion for assistance.

## 2020-06-02 NOTE — Progress Notes (Signed)
  Echocardiogram 2D Echocardiogram has been performed.  Celene Skeen 06/02/2020, 3:25 PM

## 2020-06-02 NOTE — Progress Notes (Signed)
NAME:  [name], MRN:  [id number], DOB:  1970-09-11, LOS: 3 ADMISSION DATE:  05/30/2020, CONSULTATION DATE:  12/3 REFERRING MD:  Allena Katz, CHIEF COMPLAINT:  PE   Brief History   49 y/o female admitted 12/2 with submassive PE and R heart strain by echo and elevated troponin.  Past Medical History  Morbid obesity Prior cellulitis Prior abdominal hysterectomy  Significant Hospital Events   12/2 admission, started IV heparin 12/3 IV TPA  Consults:  PCCM  Procedures:    Significant Diagnostic Tests:  CT chest 12/3: large central obstructing pulmonary embolus with saddle configuration, image 33/4. Near occlusive emboli are identified extending into the right upper lobe, right lower lobe, left upper lobe, and left lower lobe pulmonary arteries. There is CT evidence of right heart strain (RV/LV Ratio = 1.2). ECHO 12/3: 1. Left ventricular ejection fraction, by estimation, is 60 to 65%. The  left ventricle has normal function. The left ventricle has no regional wall motion abnormalities. Left ventricular diastolic parameters are  consistent with Grade II diastolic  dysfunction (pseudonormalization).  2. + McConnell's sign - c/w pulmonary embolus. Right ventricular systolic function is severely reduced. The right ventricular size is severely enlarged. There is severely elevated  pulmonary artery systolic pressure. The estimated right ventricular systolic pressure is 77.7 mmHg.3. Right atrial size was mildly dilated. 4. The mitral valve is normal in structure. No evidence of mitral valve regurgitation. No evidence of mitral stenosis. 5. The aortic valve is normal in structure. Aortic valve regurgitation is not visualized. No aortic stenosis is present. 6. There is a large saddle pulmonary embolus at the bifurcation of the main pulmonary artery.  12/4 Lower extremity vascular ultrasound> positive for DVT in popliteal vein on left  Micro Data:    Antimicrobials:     Interim  history/subjective:   Feeling better Slept OK  Objective   Blood pressure (!) 148/92, pulse 80, temperature 99 F (37.2 C), temperature source Oral, resp. rate 13, height 5\' 5"  (1.651 m), weight 134.8 kg, SpO2 95 %.        Intake/Output Summary (Last 24 hours) at 06/02/2020 0720 Last data filed at 06/02/2020 0500 Gross per 24 hour  Intake 283.88 ml  Output 3600 ml  Net -3316.12 ml   Filed Weights   05/31/20 0500 06/01/20 0500 06/02/20 0500  Weight: 134.4 kg 136 kg 134.8 kg    Examination:  General:  Resting comfortably in bed HENT: NCAT OP clear PULM: CTA B, normal effort CV: RRR, no mgr GI: BS+, soft, nontender MSK: normal bulk and tone Neuro: awake, alert, no distress, MAEW   Resolved Hospital Problem list     Assessment & Plan:  Remains critically ill due to Acute pulmonary embolism with shock (elevated lactic acid) and severe clot burden with very high PA pressure on echo.  S/p IV TPA on 12/3, feeling better on 12/4 and 12/5, shock improved Unprovoked pulmonary embolism (though obesity a risk factor) Follow up echo today: if pressure significantly improved then will plan for converting to oral anticoagulation and discharging home this week.  If no improvement then consider RHC and thrombectomy. Continue heparin infusion Plan lifelong anticoagulation  Pulmonary hypertension: doubt this is all acute, suspect underlying pulmonary arterial hypertension of some sort.  Echo without LVH or LA enlargement so doubt pulmonary venous hypertension Will need RHC at some point Will need sleep study at some point Goal is to keep euvolemic (seems there today without medicine) Will need outpatient pulmonary follow up  Best practice (evaluated daily)   Per [facility]  Labs   CBC: Recent Labs  Lab 05/30/20 1520 05/30/20 1721 05/31/20 0042 05/31/20 1730 05/31/20 1849 06/01/20 0500 06/02/20 0535  WBC 6.4   < > 6.7 7.2 6.3 6.2 5.6  NEUTROABS 3.4  --   --   --   --   --   --     HGB 14.5   < > 14.0 14.1 12.9 12.7 13.0  HCT 44.1   < > 45.0 44.3 40.4 39.1 40.1  MCV 89.8   < > 93.9 91.5 92.2 91.4 90.5  PLT 216   < > 200 216 203 162 200   < > = values in this interval not displayed.    Basic Metabolic Panel: Recent Labs  Lab 05/30/20 1520 05/31/20 0042 06/01/20 0500 06/02/20 0535  NA 138 136 137 137  K 4.1 3.7 3.3* 3.7  CL 106 107 105 104  CO2 22 17* 21* 24  GLUCOSE 143* 200* 131* 133*  BUN 11 11 15 11   CREATININE 1.17* 1.20* 1.10* 0.97  CALCIUM 8.9 8.8* 8.5* 8.7*   GFR: Estimated Creatinine Clearance: 99.7 mL/min (by C-G formula based on SCr of 0.97 mg/dL). Recent Labs  Lab 05/31/20 0042 05/31/20 1721 05/31/20 1730 05/31/20 1849 06/01/20 0500 06/02/20 0535  WBC   < >  --  7.2 6.3 6.2 5.6  LATICACIDVEN  --  2.9*  --   --   --   --    < > = values in this interval not displayed.    Liver Function Tests: Recent Labs  Lab 05/30/20 1520  AST 64*  ALT 96*  ALKPHOS 97  BILITOT 1.2  PROT 7.3  ALBUMIN 3.6   No results for input(s): LIPASE, AMYLASE in the last 168 hours. No results for input(s): AMMONIA in the last 168 hours.  ABG    Component Value Date/Time   PHART 7.459 (H) 05/31/2020 1643   PCO2ART 30.5 (L) 05/31/2020 1643   PO2ART 62.1 (L) 05/31/2020 1643   HCO3 21.3 05/31/2020 1643   ACIDBASEDEF 1.1 05/31/2020 1643   O2SAT 90.4 05/31/2020 1643     Coagulation Profile: Recent Labs  Lab 05/31/20 1730 05/31/20 1849  INR 1.1 1.1    Cardiac Enzymes: No results for input(s): CKTOTAL, CKMB, CKMBINDEX, TROPONINI in the last 168 hours.  HbA1C: No results found for: HGBA1C  CBG: No results for input(s): GLUCAP in the last 168 hours.   Critical care time: 30 minutes     14/03/21, MD [place] P[facility]M Pager: [phone] Cell: [phone] If no response, call [phone]

## 2020-06-02 NOTE — Progress Notes (Signed)
[facility]  Repeat echo: RV dilated, reduced RV function but PA not estimated.  Perhaps slight improvement in RV function  Plan: Move to tele Have asked heart failure service to consider RHC prior to hospital discharge given high risk situation with RV dysfunction and high likelihood of longstanding pulmonary hypertension  Heber Bulverde, MD [place] [facility] Pager: [phone] Cell: [phone] If no response, call [phone]

## 2020-06-03 ENCOUNTER — Inpatient Hospital Stay (HOSPITAL_COMMUNITY): Payer: Federal, State, Local not specified - PPO

## 2020-06-03 ENCOUNTER — Telehealth: Payer: Self-pay | Admitting: Critical Care Medicine

## 2020-06-03 ENCOUNTER — Encounter (HOSPITAL_COMMUNITY): Payer: Self-pay | Admitting: Family Medicine

## 2020-06-03 DIAGNOSIS — I517 Cardiomegaly: Secondary | ICD-10-CM | POA: Diagnosis not present

## 2020-06-03 DIAGNOSIS — I509 Heart failure, unspecified: Secondary | ICD-10-CM | POA: Diagnosis not present

## 2020-06-03 DIAGNOSIS — I2609 Other pulmonary embolism with acute cor pulmonale: Secondary | ICD-10-CM

## 2020-06-03 DIAGNOSIS — I2699 Other pulmonary embolism without acute cor pulmonale: Secondary | ICD-10-CM | POA: Diagnosis not present

## 2020-06-03 DIAGNOSIS — I2692 Saddle embolus of pulmonary artery without acute cor pulmonale: Secondary | ICD-10-CM | POA: Diagnosis not present

## 2020-06-03 LAB — CBC
HCT: 40.6 % (ref 36.0–46.0)
Hemoglobin: 13 g/dL (ref 12.0–15.0)
MCH: 29.4 pg (ref 26.0–34.0)
MCHC: 32 g/dL (ref 30.0–36.0)
MCV: 91.9 fL (ref 80.0–100.0)
Platelets: 204 10*3/uL (ref 150–400)
RBC: 4.42 MIL/uL (ref 3.87–5.11)
RDW: 14.8 % (ref 11.5–15.5)
WBC: 5 10*3/uL (ref 4.0–10.5)
nRBC: 0 % (ref 0.0–0.2)

## 2020-06-03 LAB — BASIC METABOLIC PANEL
Anion gap: 11 (ref 5–15)
BUN: 12 mg/dL (ref 6–20)
CO2: 23 mmol/L (ref 22–32)
Calcium: 9 mg/dL (ref 8.9–10.3)
Chloride: 103 mmol/L (ref 98–111)
Creatinine, Ser: 1.02 mg/dL — ABNORMAL HIGH (ref 0.44–1.00)
GFR, Estimated: >60 mL/min (ref >60)
Glucose, Bld: 133 mg/dL — ABNORMAL HIGH (ref 70–99)
Potassium: 3.5 mmol/L (ref 3.5–5.1)
Sodium: 137 mmol/L (ref 135–145)

## 2020-06-03 LAB — HOMOCYSTEINE: Homocysteine: 13 umol/L (ref 0.0–14.5)

## 2020-06-03 LAB — HEPARIN LEVEL (UNFRACTIONATED): Heparin Unfractionated: 0.54 IU/mL (ref 0.30–0.70)

## 2020-06-03 MED ORDER — RIVAROXABAN 15 MG PO TABS
15.0000 mg | ORAL_TABLET | Freq: Two times a day (BID) | ORAL | Status: DC
  Administered 2020-06-03 – 2020-06-04 (×2): 15 mg via ORAL
  Filled 2020-06-03 (×2): qty 1

## 2020-06-03 MED ORDER — RIVAROXABAN 20 MG PO TABS: 20.0000 mg | ORAL_TABLET | Freq: Every day | ORAL | Status: DC

## 2020-06-03 MED ORDER — IOHEXOL 350 MG/ML SOLN
100.0000 mL | Freq: Once | INTRAVENOUS | Status: AC | PRN
  Administered 2020-06-03: 100 mL via INTRAVENOUS

## 2020-06-03 NOTE — TOC Progression Note (Signed)
Transition of Care [facility]) - Progression Note    Patient Details  Name: EMIE SOMMERFELD MRN: [id number] Date of Birth: 09-29-1970  Transition of Care [facility]) CM/SW Contact  Ari Bernabei, Olegario Messier, RN Phone Number: 06/03/2020, 2:39 PM  Clinical Narrative:  Will check benefit for xarelto vs apixaban-await response for benefit eligibility in progress note.     Expected Discharge Plan: Home/Self Care Barriers to Discharge: No Barriers Identified  Expected Discharge Plan and Services Expected Discharge Plan: Home/Self Care   Discharge Planning Services: CM Consult   Living arrangements for the past 2 months: Single Family Home                                       Social Determinants of Health (SDOH) Interventions    Readmission Risk Interventions No flowsheet data found.

## 2020-06-03 NOTE — Progress Notes (Signed)
[facility] Progress Note  Patient: [name]    XBM:[id number]  DOA: 05/30/2020     Date of Service: the patient was seen and examined on 06/03/2020  Brief hospital course: No significant past medical history other than obesity.  Presents with complaints of 1 month of shortness of breath.  Found to have acute saddle PE. Currently plan is continue heparin and further work-up for RV failure.  Assessment and Plan: 1.  Acute saddle pulmonary embolism. Right heart strain. Elevated troponin. Acute hypoxic respiratory failure. Resolved Pulmonary hypertension Currently on room air CT scan shows evidence of large saddle pulmonary embolism. Initially started on IV heparin. Lower extremity Doppler also positive for DVT. Given the size of the PE, elevation of the troponin as well as severe RV dysfunction seen on the echocardiogram PCCM was consulted. Highly appreciate their assistance. Understandably patient remains at high risk for catheter guided thrombolysis given her poor RV. Tolerated TPA very well. Repeat echocardiogram on 06/02/2020 shows mild improvement in RV failure. CT PE protocol was repeated on 12/6 which shows significant improvement in the clot burden and no RV strain. At present no further plan for inpatient right heart cath. Plan is for outpatient follow-up echocardiogram and if the echocardiogram continues to show improvement no further work-up for right heart failure. Discussed options for anticoagulation with patient at length including warfarin and DOAC.  Patient currently willing to take DOAC.  Based on insurance currently she wants to switch to Xarelto.  Risk benefit, method of medication injection all discussed at length.  2.  Morbid obesity Body mass index is 48.5 kg/m.  Placing the patient at high risk for poor outcomes. We will monitor. Patient will benefit from outpatient dietary consultation.  3.  Hypercoagulability Etiology of patient's PE is not  clear. She does not have any immobilization history not she has any medication history that can increase risk for PE. 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 Hypercoagulable panel Antithrombin III 88 normal, total protein C 85% normal, beta-2 glycoprotein normal, serum homocystine normal Pending labs protein C activity, protein S activity, protein S total, lupus anticoagulant, factor V Leyden, prothrombin gene mutation, cardiolipin antibody.  4.  Essential hypertension  blood pressure elevated. Also has lower extremity edema. Continue hydrochlorothiazide  5.  Hypokalemia. Replacing.   6.  Needs sleep apnea assessment. Family member at bedside reports patient has been snoring and occasionally stops breathing. Patient denies having any complaints of early morning headaches, but reports daytime sleepiness. Likely partially responsible for patient's right heart failure as well. Will benefit from outpatient sleep study.  Diet: Cardiac diet Rivaroxaban (XARELTO) tablet 15 mg  rivaroxaban (XARELTO) tablet 20 mg  Advance goals of care discussion: Full code  Family Communication: family was present at bedside, at the time of interview.  The pt provided permission to discuss medical plan with the family. Opportunity was given to ask question and all questions were answered satisfactorily.   Disposition:  Status is: Inpatient  Remains inpatient appropriate because:Ongoing diagnostic testing needed not appropriate for outpatient work up  Dispo: The patient is from: Home              Anticipated d/c is to: Home              Anticipated d/c date is: 2 days              Patient currently is not medically stable  to d/c.  Subjective: No nausea no vomiting.  Continues to have no bleeding.  No fever no chills.  Physical Exam:  General: Appear in mild distress, no Rash; Oral Mucosa Clear, moist. no Abnormal Neck Mass  Or lumps, Conjunctiva normal  Cardiovascular: S1 and S2 Present, no Murmur, Respiratory: good respiratory effort, Bilateral Air entry present faint Crackles, no wheezes Abdomen: Bowel Sound present, Soft and no tenderness Extremities: trace Pedal edema Neurology: alert and oriented to time, place, and person affect appropriate. no new focal deficit Gait not checked due to patient safety concerns  Vitals:   06/03/20 0500 06/03/20 0553 06/03/20 0907 06/03/20 1206  BP:  123/77 129/76 127/89  Pulse:  83 83 86  Resp:  18 15 19   Temp:  98 F (36.7 C) 97.9 F (36.6 C) (!) 97.2 F (36.2 C)  TempSrc:      SpO2:  95% 92% 99%  Weight: 132.2 kg     Height:        Intake/Output Summary (Last 24 hours) at 06/03/2020 2016 Last data filed at 06/03/2020 1739 Gross per 24 hour  Intake 766.59 ml  Output 300 ml  Net 466.59 ml   Filed Weights   06/01/20 0500 06/02/20 0500 06/03/20 0500  Weight: 136 kg 134.8 kg 132.2 kg    Data Reviewed: I have personally reviewed and interpreted daily labs, tele strips, imagings as discussed above. I reviewed all nursing notes, pharmacy notes, vitals, pertinent old records I have discussed plan of care as described above with RN and patient/family.  CBC: Recent Labs  Lab 05/30/20 1520 05/30/20 1721 05/31/20 1730 05/31/20 1849 06/01/20 0500 06/02/20 0535 06/03/20 0320  WBC 6.4   < > 7.2 6.3 6.2 5.6 5.0  NEUTROABS 3.4  --   --   --   --   --   --   HGB 14.5   < > 14.1 12.9 12.7 13.0 13.0  HCT 44.1   < > 44.3 40.4 39.1 40.1 40.6  MCV 89.8   < > 91.5 92.2 91.4 90.5 91.9  PLT 216   < > 216 203 162 200 204   < > = values in this interval not displayed.   Basic Metabolic Panel: Recent Labs  Lab 05/30/20 1520 05/31/20 0042 06/01/20 0500 06/02/20 0535 06/03/20 0320  NA 138 136 137 137 137  K 4.1 3.7 3.3* 3.7 3.5  CL 106 107 105 104 103  CO2 22 17* 21* 24 23  GLUCOSE 143* 200* 131* 133* 133*  BUN 11 11 15 11 12   CREATININE 1.17* 1.20* 1.10*  0.97 1.02*  CALCIUM 8.9 8.8* 8.5* 8.7* 9.0    Studies: CT ANGIO CHEST PE W OR WO CONTRAST  Result Date: 06/03/2020 CLINICAL DATA:  Follow-up pulmonary embolism clot bird EXAM: CT ANGIOGRAPHY CHEST WITH CONTRAST TECHNIQUE: Multidetector CT imaging of the chest was performed using the standard protocol during bolus administration of intravenous contrast. Multiplanar CT image reconstructions and MIPs were obtained to evaluate the vascular anatomy. CONTRAST:  OMNIPAQUE IOHEXOL 350 MG/ML SOLN COMPARISON:  Four days ago FINDINGS: Cardiovascular: On prior there was central pulmonary emboli with saddle clot. The saddle embolus has resolved and there is decreasing main and lobar clot on both sides. The right heart and pulmonary outflow track appear less distended. No pericardial effusion or cardiomegaly. Mediastinum/Nodes: Negative Lungs/Pleura: No infarct or failure.  No effusion or pneumothorax. Upper Abdomen: Cholecystectomy clips. Musculoskeletal: Spondylosis. Review of the MIP images confirms the above findings. IMPRESSION: Significantly  decreased clot burden. No evidence of interval emboli or right heart failure. Electronically Signed   By: Marnee Spring M.D.   On: 06/03/2020 10:47    Scheduled Meds: . Chlorhexidine Gluconate Cloth  6 each Topical Daily  . hydrochlorothiazide  25 mg Oral Daily  . mouth rinse  15 mL Mouth Rinse BID  . rivaroxaban  15 mg Oral BID WC   Followed by  . [START ON 06/24/2020] rivaroxaban  20 mg Oral Q supper  . sodium chloride flush  10-40 mL Intracatheter Q12H  . sodium chloride flush  3 mL Intravenous Q12H   Continuous Infusions:  PRN Meds: acetaminophen **OR** acetaminophen, ondansetron **OR** ondansetron (ZOFRAN) IV, sodium chloride flush  Time spent: 35 minutes  Author: Lynden Oxford, MD [facility] 06/03/2020 8:16 PM  To reach On-call, see care teams to locate the attending and reach out via [email or web address]. Between 7PM-7AM, please contact  night-coverage If you still have difficulty reaching the attending provider, please page the [facility] (Director on Call) for [facility] on amion for assistance.

## 2020-06-03 NOTE — TOC Benefit Eligibility Note (Deleted)
Transition of Care [facility]) Benefit Eligibility Note    Patient Details  Name: SHAWONDA KERCE MRN: [id number] Date of Birth: August 21, 1970   Medication/Dose: Eliquis 10 mg po bid x7 days then 7 days 47m.  Xarelto 141mpo bid x 21 days then 20 mg bid. Generic not on formulay for either Rx  Covered?: Yes  Tier: 2 Drug  Prescription Coverage [facility]  Spoke with Person/Company/Phone Number:: Elissa/ [facility] [employer] [phone]-Pay: Eliquis 1559m4.62 / Eliquis % 151.92  Prior Approval: No  Deductible: Met       FulKerin Salenone Number: 06/03/2020, 3:00 PM

## 2020-06-03 NOTE — Progress Notes (Signed)
ANTICOAGULATION CONSULT NOTE  Pharmacy Consult for Heparin  Indication: pulmonary embolus  Allergies  Allergen Reactions  . Sulfa Antibiotics Rash    Patient Measurements: Height: 5\' 5"  (165.1 cm) Weight: 132.2 kg (291 lb 7.2 oz) IBW/kg (Calculated) : 57 Heparin Dosing Weight: 90 kg  Vital Signs: Temp: 97.2 F (36.2 C) (12/06 1206) BP: 127/89 (12/06 1206) Pulse Rate: 86 (12/06 1206)  Labs: Recent Labs    05/31/20 1730 05/31/20 1730 05/31/20 1849 05/31/20 1849 05/31/20 2045 05/31/20 2245 06/01/20 0500 06/01/20 0910 06/02/20 0535 06/02/20 0535 06/02/20 1336 06/02/20 1716 06/03/20 0320  HGB 14.1   < > 12.9   < >  --   --  12.7   < > 13.0  --   --   --  13.0  HCT 44.3   < > 40.4   < >  --   --  39.1  --  40.1  --   --   --  40.6  PLT 216   < > 203   < >  --   --  162  --  200  --   --   --  204  APTT 81*   < > 36  --  84* 52*  --   --   --   --   --   --   --   LABPROT 14.2  --  13.7  --   --   --   --   --   --   --   --   --   --   INR 1.1  --  1.1  --   --   --   --   --   --   --   --   --   --   HEPARINUNFRC  --   --   --   --   --   --   --    < > 0.36   < > 2.01* 0.47 0.54  CREATININE  --   --   --   --   --   --  1.10*  --  0.97  --   --   --  1.02*   < > = values in this interval not displayed.    Estimated Creatinine Clearance: 93.8 mL/min (A) (by C-G formula based on SCr of 1.02 mg/dL (H)).   Assessment: Patient is a 49 year old female presenting with SOB. CTA revealed acute bilateral saddle PE with right heart strain. Pharmacy consulted to dose/monitor heparin.  Pt not on anticoagulants PTA.  Significant Events: -12/3: alteplase 100 mg IV once over 2 hours given for PE Heparin drip resumed once aPTT < 80 seconds post-tPA  Today, 06/03/20  Daily heparin level therapeutic on 1250 units/hr  No bleeding or infusion related issues reported by RN  CBC: Stable & WNL s/p TPA  Goal of Therapy:  HL 0.3-0.7 Monitor platelets by anticoagulation  protocol: Yes   Plan:   Continue heparin infusion at current rate of 1250 units/hr  HL, CBC daily while on heparin infusion  Monitor for signs of bleeding  F/u for transition to oral agent - will need to weigh risks vs benefits of DOAC vs VKA in patient > 120 kg (although borderline weight) in setting of large PE  14/06/21, PharmD, BCPS [phone] 06/03/2020, 2:08 PM

## 2020-06-03 NOTE — Plan of Care (Signed)
  Problem: Clinical Measurements: Goal: Will remain free from infection Outcome: Progressing Goal: Diagnostic test results will improve Outcome: Progressing   Problem: Nutrition: Goal: Adequate nutrition will be maintained Outcome: Adequate for Discharge   Problem: Education: Goal: Knowledge of General Education information will improve Description: Including pain rating scale, medication(s)/side effects and non-pharmacologic comfort measures Outcome: Completed/Met   Problem: Coping: Goal: Level of anxiety will decrease Outcome: Completed/Met   Problem: Elimination: Goal: Will not experience complications related to bowel motility Outcome: Completed/Met Goal: Will not experience complications related to urinary retention Outcome: Completed/Met   Problem: Safety: Goal: Ability to remain free from injury will improve Outcome: Completed/Met   Problem: Skin Integrity: Goal: Risk for impaired skin integrity will decrease Outcome: Completed/Met

## 2020-06-03 NOTE — TOC Benefit Eligibility Note (Incomplete)
Transition of Care [facility]) Benefit Eligibility Note    Patient Details  Name: [name] MRN: [id number] Date of Birth: 05/20/1971   Medication/Dose: Eliquis 10 mg po bid x 7 days then 63m  / Xarelto 120mpo bid x 21 days 20 mg bid  Covered?: Yes  Tier: 2 Drug  Prescription Coverage [facility]  Spoke with Person/Company/Phone Number:: Elissia/ [facility] [employer] [phone]-Pay: Eliquis is $151.92 and Xarelto $4.62  Prior Approval: No  Deductible: Met       FuKerin Salenhone Number: 06/03/2020, 3:21 PM

## 2020-06-03 NOTE — TOC Benefit Eligibility Note (Signed)
Transition of Care [facility]) Benefit Eligibility Note    Patient Details  Name: [name] MRN: [id number] Date of Birth: 1971/04/27   Medication/Dose: Eliquis 10 mg po bid x 7 days then 93m  / Xarelto 181mpo bid x 21 days 20 mg bid  Covered?: Yes  Tier: 2 Drug  Prescription Coverage [facility]  Spoke with Person/Company/Phone Number:: Elissia/ [facility] [employer] [phone]-Pay: Eliquis is $151.92 and Xarelto $4.62  Prior Approval: No  Deductible: Met       FuKerin Salenhone Number: 06/03/2020, 3:20 PM

## 2020-06-03 NOTE — Progress Notes (Signed)
L midline in place, site unremarkable. Noted pt has several bruises to forearm. Will leave midline in place for labs.

## 2020-06-03 NOTE — Telephone Encounter (Signed)
Please schedule for follow up with Dr. Judeth Horn to establish care in 4 weeks.  Steffanie Dunn, DO 06/03/20 6:14 PM [facility]

## 2020-06-03 NOTE — TOC Benefit Eligibility Note (Deleted)
Transition of Care [facility]) Benefit Eligibility Note    Patient Details  Name: [name] MRN: [id number] Date of Birth: 08-26-1970   Medication/Dose: Eliquis 10 mg po bid x7 days then 7 days 78m.  Xarelto 154mpo bid x 21 days then 20 mg bid. Generic not on formulay for either Rx  Covered?: Yes  Tier: 2 Drug  Prescription Coverage [facility]  Spoke with Person/Company/Phone Number:: Elissa/ [facility] [employer] [phone]-Pay: Eliquis 1577m4.62 / Eliquis % 151.92  Prior Approval: No  Deductible: Met   Typed in error Eliquis  $151.92 and Xarelto is $4.62    FulKerin Salenone Number: 06/03/2020, 3:12 PM

## 2020-06-03 NOTE — Progress Notes (Signed)
ANTICOAGULATION CONSULT NOTE  Pharmacy Consult for Xarelto  Indication: pulmonary embolus  Allergies  Allergen Reactions  . Sulfa Antibiotics Rash   Patient Measurements: Height: 5\' 5"  (165.1 cm) Weight: 132.2 kg (291 lb 7.2 oz) IBW/kg (Calculated) : 57 Heparin Dosing Weight: 90 kg  Vital Signs: Temp: 97.2 F (36.2 C) (12/06 1206) BP: 127/89 (12/06 1206) Pulse Rate: 86 (12/06 1206)  Labs: Recent Labs    05/31/20 1730 05/31/20 1730 05/31/20 1849 05/31/20 1849 05/31/20 2045 05/31/20 2245 06/01/20 0500 06/01/20 0910 06/02/20 0535 06/02/20 0535 06/02/20 1336 06/02/20 1716 06/03/20 0320  HGB 14.1   < > 12.9   < >  --   --  12.7   < > 13.0  --   --   --  13.0  HCT 44.3   < > 40.4   < >  --   --  39.1  --  40.1  --   --   --  40.6  PLT 216   < > 203   < >  --   --  162  --  200  --   --   --  204  APTT 81*   < > 36  --  84* 52*  --   --   --   --   --   --   --   LABPROT 14.2  --  13.7  --   --   --   --   --   --   --   --   --   --   INR 1.1  --  1.1  --   --   --   --   --   --   --   --   --   --   HEPARINUNFRC  --   --   --   --   --   --   --    < > 0.36   < > 2.01* 0.47 0.54  CREATININE  --   --   --   --   --   --  1.10*  --  0.97  --   --   --  1.02*   < > = values in this interval not displayed.   Estimated Creatinine Clearance: 93.8 mL/min (A) (by C-G formula based on SCr of 1.02 mg/dL (H)).  Assessment: Patient is a 49 year old female presenting with SOB. CTA revealed acute bilateral saddle PE with right heart strain. Pharmacy consulted to dose/monitor heparin.  Pt not on anticoagulants PTA.  Significant Events: -12/3: alteplase 100 mg IV once over 2 hours given for PE Heparin drip resumed once aPTT < 80 seconds post-tPA  Today, 06/03/20  Daily heparin level therapeutic on 1250 units/hr  No bleeding or infusion related issues reported by RN  CBC: Stable & WNL s/p TPA  PM update: Transition to Xarelto for tx of PE  Goal of Therapy:  HL  0.3-0.7 Monitor platelets by anticoagulation protocol: Yes   Plan:  Discontinue Heparin at 1700 Begin Xarelto 15mg  bid with meals x 21 days Transition to Xarelto 20mg  daily with supper on 12/27 Plan Xarelto education 12/7  PharmD [facility] [phone] 06/03/2020, 3:41 PM

## 2020-06-03 NOTE — TOC Benefit Eligibility Note (Signed)
Transition of Care [facility]) Benefit Eligibility Note    Patient Details  Name: [name] MRN: [id number] Date of Birth: 1970-11-22   Medication/Dose: Eliquis 10 mg po bid x 7 days then 38m  / Xarelto 18mpo bid x 21 days 20 mg bid  Covered?: Yes  Tier: 2 Drug  Prescription Coverage [facility]  Spoke with Person/Company/Phone Number:: Elissia/ [facility] [employer] [phone]-Pay: Eliquis is $151.92 and Xarelto $4.62  Prior Approval: No  Deductible: Met       FuKerin Salenhone Number: 06/03/2020, 3:19 PM

## 2020-06-03 NOTE — Progress Notes (Addendum)
NAME:  [name], MRN:  [id number], DOB:  Nov 28, 1970, LOS: 4 ADMISSION DATE:  05/30/2020, CONSULTATION DATE:  12/3 REFERRING MD:  Allena Katz, CHIEF COMPLAINT:  PE   Brief History   49 y/o female admitted 12/2 with 1 month hx of SOB.  Work up consistent with submassive PE, R heart strain by ECHO and elevated troponin.  Past Medical History  Morbid obesity Prior cellulitis Prior abdominal hysterectomy  Significant Hospital Events   12/02 admission, started IV heparin 12/03 IV TPA 12/06 Repeat CTa chest with reduction of clot burden, no R heart strain   Consults:  PCCM  Procedures:    Significant Diagnostic Tests:   CT chest 12/3 >> large central obstructing pulmonary embolus with saddle configuration, image 33/4. Near occlusive emboli are identified extending into the right upper lobe, right lower lobe, left upper lobe, and left lower lobe pulmonary arteries. There is CT evidence of right heart strain (RV/LV Ratio = 1.2).  ECHO 12/3 >> Left ventricular ejection fraction, by estimation, is 60 to 65%. The left ventricle has normal function. The left ventricle has no regional wall motion abnormalities. Left ventricular diastolic parameters are  consistent with Grade II diastolic dysfunction (pseudonormalization). + McConnell's sign - c/w pulmonary embolus. Right ventricular systolic function is severely reduced. The right ventricular size is severely enlarged. There is severely elevated pulmonary artery systolic pressure. The estimated right ventricular systolic pressure is 77.7 mmHg.3. Right atrial size was mildly dilated. The mitral valve is normal in structure. No evidence of mitral valve regurgitation. No evidence of mitral stenosis. The aortic valve is normal in structure. Aortic valve regurgitation is not visualized. No aortic stenosis is present. There is a large saddle pulmonary embolus at the bifurcation of the main pulmonary artery.   LE Venous Duplex 12/4 >> positive for DVT in  popliteal vein on left  ECHO 12/5 >> no significant change in appearance or function of the RV, RV size and function may have improved slightly, LVEF 60-65%, interventricular septal flattening in systole consistent with RV pressure overload  CTA Chest 12/6 >> significantly decreased clot burden, no evidence of interval emboli or right heart failure   Micro Data:  COVID 12/2 >> negative  Influenza A/B 12/12 >> negative MRSA PCR 12/2 >> negative   Antimicrobials:     Interim history/subjective:  Afebrile  On RA  Pt reports feeling much better   Objective   Blood pressure 129/76, pulse 83, temperature 97.9 F (36.6 C), resp. rate 15, height 5\' 5"  (1.651 m), weight 132.2 kg, SpO2 92 %.        Intake/Output Summary (Last 24 hours) at 06/03/2020 1123 Last data filed at 06/03/2020 0857 Gross per 24 hour  Intake 454.31 ml  Output 925 ml  Net -470.69 ml   Filed Weights   06/01/20 0500 06/02/20 0500 06/03/20 0500  Weight: 136 kg 134.8 kg 132.2 kg    Examination: General: pleasant adult female lying in bed in NAD  HEENT: MM pink/moist, good dentition, anicteric  Neuro: AAOx4, speech clear, MAE  CV: s1s2 RRR, no m/r/g PULM: non-labored on RA, lungs bilaterally clear GI: soft, bsx4 active  Extremities: warm/dry, no edema  Skin: no rashes or lesions  Resolved Hospital Problem list   Shock secondary to PE  Assessment & Plan:   Acute Pulmonary Embolism  Initially admitted with shock, elevated lactate, severe clot burden with elevated PA pressures on ECHO. S/P IV tPA on 12/3 with improvement in hemodynamics.  Thought to be an  unprovoked PE (although obesity a risk factor.  -repeat CTA chest 12/6 with significant reduction of clot burden and no right heart strain, given improvement in imaging, no role for thrombectomy at this point, suspect residual ECHO findings of RV volume overload may be chronic in nature -continue heparin infusion  -consider transition to oral anticoagulation  12/6 or 12/7 (await Dr. Ophelia Charter input) > would ask CM to review for med coverage.  -plan for lifelong anticoagulation  -follow up on initial hypercoagulable panel results  -reviewed risks of anticoagulation with patient, discussed safety measures with food prep etc, consider medical bracelet   Pulmonary Hypertension Doubt this is all acute, suspect underlying pulmonary arterial hypertension of some sort.  Echo without LVH or LA enlargement so doubt pulmonary venous hypertension -will need RHC at some point -sleep study as outpatient, f/u arranged  -will need outpatient pulmonary follow up  Best practice (evaluated daily)  Per [facility]  Labs   CBC: Recent Labs  Lab 05/30/20 1520 05/30/20 1721 05/31/20 1730 05/31/20 1849 06/01/20 0500 06/02/20 0535 06/03/20 0320  WBC 6.4   < > 7.2 6.3 6.2 5.6 5.0  NEUTROABS 3.4  --   --   --   --   --   --   HGB 14.5   < > 14.1 12.9 12.7 13.0 13.0  HCT 44.1   < > 44.3 40.4 39.1 40.1 40.6  MCV 89.8   < > 91.5 92.2 91.4 90.5 91.9  PLT 216   < > 216 203 162 200 204   < > = values in this interval not displayed.    Basic Metabolic Panel: Recent Labs  Lab 05/30/20 1520 05/31/20 0042 06/01/20 0500 06/02/20 0535 06/03/20 0320  NA 138 136 137 137 137  K 4.1 3.7 3.3* 3.7 3.5  CL 106 107 105 104 103  CO2 22 17* 21* 24 23  GLUCOSE 143* 200* 131* 133* 133*  BUN 11 11 15 11 12   CREATININE 1.17* 1.20* 1.10* 0.97 1.02*  CALCIUM 8.9 8.8* 8.5* 8.7* 9.0   GFR: Estimated Creatinine Clearance: 93.8 mL/min (A) (by C-G formula based on SCr of 1.02 mg/dL (H)). Recent Labs  Lab 05/31/20 1721 05/31/20 1730 05/31/20 1849 06/01/20 0500 06/02/20 0535 06/03/20 0320  WBC  --    < > 6.3 6.2 5.6 5.0  LATICACIDVEN 2.9*  --   --   --   --   --    < > = values in this interval not displayed.    Liver Function Tests: Recent Labs  Lab 05/30/20 1520  AST 64*  ALT 96*  ALKPHOS 97  BILITOT 1.2  PROT 7.3  ALBUMIN 3.6   No results for input(s): LIPASE,  AMYLASE in the last 168 hours. No results for input(s): AMMONIA in the last 168 hours.  ABG    Component Value Date/Time   PHART 7.459 (H) 05/31/2020 1643   PCO2ART 30.5 (L) 05/31/2020 1643   PO2ART 62.1 (L) 05/31/2020 1643   HCO3 21.3 05/31/2020 1643   ACIDBASEDEF 1.1 05/31/2020 1643   O2SAT 90.4 05/31/2020 1643     Coagulation Profile: Recent Labs  Lab 05/31/20 1730 05/31/20 1849  INR 1.1 1.1    Cardiac Enzymes: No results for input(s): CKTOTAL, CKMB, CKMBINDEX, TROPONINI in the last 168 hours.  HbA1C: No results found for: HGBA1C  CBG: No results for input(s): GLUCAP in the last 168 hours.   Critical care time: n/a    14/03/21, MSN, NP-C, AGACNP-BC [facility] 06/03/2020, 11:37 AM   Please see [email or web address] for pager details.

## 2020-06-04 ENCOUNTER — Other Ambulatory Visit (HOSPITAL_COMMUNITY): Payer: Self-pay | Admitting: Internal Medicine

## 2020-06-04 ENCOUNTER — Encounter (HOSPITAL_COMMUNITY): Payer: Self-pay | Admitting: Family Medicine

## 2020-06-04 DIAGNOSIS — I2692 Saddle embolus of pulmonary artery without acute cor pulmonale: Secondary | ICD-10-CM | POA: Diagnosis not present

## 2020-06-04 LAB — BASIC METABOLIC PANEL
Anion gap: 8 (ref 5–15)
BUN: 15 mg/dL (ref 6–20)
CO2: 26 mmol/L (ref 22–32)
Calcium: 9 mg/dL (ref 8.9–10.3)
Chloride: 102 mmol/L (ref 98–111)
Creatinine, Ser: 0.91 mg/dL (ref 0.44–1.00)
GFR, Estimated: >60 mL/min (ref >60)
Glucose, Bld: 135 mg/dL — ABNORMAL HIGH (ref 70–99)
Potassium: 3.6 mmol/L (ref 3.5–5.1)
Sodium: 136 mmol/L (ref 135–145)

## 2020-06-04 LAB — CARDIOLIPIN ANTIBODIES, IGG, IGM, IGA
Anticardiolipin IgA: <9 APL U/mL (ref 0–11)
Anticardiolipin IgG: <9 GPL U/mL (ref 0–14)
Anticardiolipin IgM: <9 MPL U/mL (ref 0–12)

## 2020-06-04 MED ORDER — RIVAROXABAN (XARELTO) VTE STARTER PACK (15 & 20 MG): ORAL_TABLET | ORAL | 0 refills | Status: DC

## 2020-06-04 MED ORDER — HYDROCHLOROTHIAZIDE 25 MG PO TABS
25.0000 mg | ORAL_TABLET | Freq: Every day | ORAL | 0 refills | Status: DC
Start: 2020-06-05 — End: 2020-06-05

## 2020-06-04 MED FILL — HYDROCHLOROTHIAZIDE 25 MG T: 25 | 30 days supply | Qty: 30 | Fill #0

## 2020-06-04 MED FILL — XARELTO STARTER PACK: 15 & 20 | 28 days supply | Qty: 51 | Fill #0

## 2020-06-04 NOTE — Telephone Encounter (Signed)
Called and spoke with patient, scheduled f/u with Dr. Judeth Horn in January per Dr. Chestine Spore.  Provided address to patient.  Nothing further needed.

## 2020-06-04 NOTE — Progress Notes (Signed)
Pt discharged at this time. Pt discharge home with daughter. No c/o pain or discomfort. Midline and telemetry removed.[name]

## 2020-06-04 NOTE — Discharge Instructions (Signed)
Information on my medicine - XARELTO (rivaroxaban)  This medication education was reviewed with me or my healthcare representative as part of my discharge preparation.  The pharmacist that spoke with me during my hospital stay was:  Otho Bellows, [facility]  WHY WAS XARELTO PRESCRIBED FOR YOU? Xarelto was prescribed to treat blood clots that may have been found in the veins of your legs (deep vein thrombosis) or in your lungs (pulmonary embolism) and to reduce the risk of them occurring again.  What do you need to know about Xarelto? The starting dose is one 15 mg tablet taken TWICE daily with food for the FIRST 21 DAYS then around 12/27 (follow dosepack instructions)  the dose is changed to one 20 mg tablet taken ONCE A DAY with breakfast.  DO NOT stop taking Xarelto without talking to the health care provider who prescribed the medication.  Refill your prescription for 20 mg tablets before you run out.  After discharge, you should have regular check-up appointments with your healthcare provider that is prescribing your Xarelto.  In the future your dose may need to be changed if your kidney function changes by a significant amount.  What do you do if you miss a dose? If you are taking Xarelto TWICE DAILY and you miss a dose, take it as soon as you remember. You may take two 15 mg tablets (total 30 mg) at the same time then resume your regularly scheduled 15 mg twice daily the next day.  If you are taking Xarelto ONCE DAILY and you miss a dose, take it as soon as you remember on the same day then continue your regularly scheduled once daily regimen the next day. Do not take two doses of Xarelto at the same time.   Important Safety Information Xarelto is a blood thinner medicine that can cause bleeding. You should call your healthcare provider right away if you experience any of the following: ? Bleeding from an injury or your nose that does not stop. ? Unusual colored urine (red or dark  brown) or unusual colored stools (red or black). ? Unusual bruising for unknown reasons. ? A serious fall or if you hit your head (even if there is no bleeding).  Some medicines may interact with Xarelto and might increase your risk of bleeding while on Xarelto. To help avoid this, consult your healthcare provider or pharmacist prior to using any new prescription or non-prescription medications, including herbals, vitamins, non-steroidal anti-inflammatory drugs (NSAIDs) and supplements.  This website has more information on Xarelto: [email or web address].com.br.

## 2020-06-05 LAB — FACTOR 5 LEIDEN

## 2020-06-05 LAB — PROTHROMBIN GENE MUTATION

## 2020-06-06 LAB — PROTEIN C ACTIVITY: Protein C Activity: 105 % (ref 73–180)

## 2020-06-06 LAB — PROTEIN S, TOTAL: Protein S Ag, Total: 87 % (ref 60–150)

## 2020-06-06 LAB — PROTEIN S ACTIVITY: Protein S Activity: 81 % (ref 63–140)

## 2020-06-06 NOTE — Discharge Summary (Incomplete)
[facility] Discharge Summary   Patient: [name]:[id number]  PCP: Harvie Heck, MD  Date of admission: 05/30/2020   Date of discharge: 06/04/2020     Discharge Diagnoses:  Principal Problem:   Acute pulmonary embolism (HCC) Active Problems:   Elevated troponin   Admitted From: home Disposition:  Home   Recommendations for Outpatient Follow-up:  1. PCP: please follow up with PCP in 1 week 2. Follow up LABS/TEST:  none   Follow-up Information    Oretha Milch, MD Follow up on 07/09/2020.   Specialty: Pulmonary Disease Why: Appt at 10:00 AM.  Please arrive at 9:45 AM for check in. Contact information: [address] [place] [address] [phone]        Harvie Heck, MD. Schedule an appointment as soon as possible for a visit in 1 week(s).   Specialty: Family Medicine Why: cbc and bmp in 2 weeks.  Contact information: [address] [place] [address] [phone]              Diet recommendation: {dietplan:22518}  Activity: The patient is advised to gradually reintroduce usual activities, as tolerated  Discharge Condition: stable  Code Status: {Palliative Code status:23503}   History of present illness: As per the H and P dictated on admission, "***"  Hospital Course:  Summary of her active problems in the hospital is as following.   *** Body mass index is 48.24 kg/m.    Nutrition Interventions:        ***Pain control  - [place] Controlled Substance Reporting System database was reviewed. - *** day supply was provided. - Patient was instructed, not to drive, operate heavy machinery, perform activities at heights, swimming or participation in water activities or provide baby sitting services while on Pain, Sleep and Anxiety Medications; until her outpatient Physician has advised to do so again.  - Also recommended to not to take more than prescribed Pain, Sleep and Anxiety  Medications.  ***Patient was ambulatory without any assistance. ***Patient was seen by physical therapy, who recommended {d/c therapy plan:22521}, *** was arranged. On the day of the discharge the patient's vitals were stable, and no other acute medical condition were reported by patient. The patient was felt safe to be discharge at {comingfrom:22515} with {d/c therapy plan:22521}.  Consultants: *** Procedures: ***  Discharge Exam: General: Appear in no distress, *** Rash; Oral Mucosa {oral exam:22516}. no Abnormal Neck Mass Or lumps, Conjunctiva normal  Cardiovascular: S1 and S2 Present, *** Murmur Respiratory: {Desc; increased/descreased:10091} respiratory effort, Bilateral Air entry present and ***CTA, *** Crackles, *** wheezes Abdomen: Bowel Sound present, Soft and *** tenderness Extremities: *** Pedal edema Neurology: {EXAM; NEURO ALERTNESS:23097} and {EXAM; NEURO PED ORIENTATION:18734} affect {Exam; psychiatric affect:30301}. *** new focal deficit  Filed Weights   06/02/20 0500 06/03/20 0500 06/04/20 0509  Weight: 134.8 kg 132.2 kg 131.5 kg   Vitals:   06/04/20 1015 06/04/20 1413  BP: 135/89 125/84  Pulse: 85 95  Resp: 18 15  Temp: 97.8 F (36.6 C) (!) 97.5 F (36.4 C)  SpO2: 96% 98%    DISCHARGE MEDICATION: Allergies as of 06/04/2020      Reactions   Sulfa Antibiotics Rash      Medication List    TAKE these medications   fluticasone 50 MCG/ACT nasal spray Commonly known as: FLONASE Place 1 spray into both nostrils daily.   hydrochlorothiazide 25 MG tablet Commonly known as: HYDRODIURIL Take 1 tablet (25 mg total) by  mouth daily.   Magnesium 250 MG Tabs Take 1 tablet by mouth daily.   multivitamin with minerals Tabs tablet Take 1 tablet by mouth daily.   Rivaroxaban Stater Pack (15 mg and 20 mg) Commonly known as: XARELTO STARTER PACK Follow package directions: Take one 15mg  tablet by mouth twice a day. On day 22, 06/24/2020 switch to one 20mg  tablet  once a day. Take with food.      Allergies  Allergen Reactions  . Sulfa Antibiotics Rash   Discharge Instructions    Ambulatory referral to Sleep Studies   Complete by: As directed    Diet - low sodium heart healthy   Complete by: As directed       The results of significant diagnostics from this hospitalization (including imaging, microbiology, ancillary and laboratory) are listed below for reference.    Significant Diagnostic Studies: CT ANGIO CHEST PE W OR WO CONTRAST  Result Date: 06/03/2020 CLINICAL DATA:  Follow-up pulmonary embolism clot bird EXAM: CT ANGIOGRAPHY CHEST WITH CONTRAST TECHNIQUE: Multidetector CT imaging of the chest was performed using the standard protocol during bolus administration of intravenous contrast. Multiplanar CT image reconstructions and MIPs were obtained to evaluate the vascular anatomy. CONTRAST:  OMNIPAQUE IOHEXOL 350 MG/ML SOLN COMPARISON:  Four days ago FINDINGS: Cardiovascular: On prior there was central pulmonary emboli with saddle clot. The saddle embolus has resolved and there is decreasing main and lobar clot on both sides. The right heart and pulmonary outflow track appear less distended. No pericardial effusion or cardiomegaly. Mediastinum/Nodes: Negative Lungs/Pleura: No infarct or failure.  No effusion or pneumothorax. Upper Abdomen: Cholecystectomy clips. Musculoskeletal: Spondylosis. Review of the MIP images confirms the above findings. IMPRESSION: Significantly decreased clot burden. No evidence of interval emboli or right heart failure. Electronically Signed   By: 14/11/2019 M.D.   On: 06/03/2020 10:47   CT Angio Chest PE W and/or Wo Contrast  Result Date: 05/30/2020 CLINICAL DATA:  Evaluate for pulmonary embolus. EXAM: CT ANGIOGRAPHY CHEST WITH CONTRAST TECHNIQUE: Multidetector CT imaging of the chest was performed using the standard protocol during bolus administration of intravenous contrast. Multiplanar CT image  reconstructions and MIPs were obtained to evaluate the vascular anatomy. CONTRAST:  14/11/2019 OMNIPAQUE IOHEXOL 350 MG/ML SOLN COMPARISON:  None. FINDINGS: Cardiovascular: Examination is positive for large central obstructing pulmonary embolus with saddle configuration, image 33/4. Near occlusive emboli are identified extending into the right upper lobe, right lower lobe, left upper lobe, and left lower lobe pulmonary arteries. There is CT evidence of right heart strain (RV/LV Ratio = 1.2). Mediastinum/Nodes: No enlarged mediastinal, hilar, or axillary lymph nodes. Thyroid gland, trachea, and esophagus demonstrate no significant findings. Lungs/Pleura: Lungs are clear. No pleural effusion or edema. No airspace consolidation identified. Upper Abdomen: No acute abnormality. Musculoskeletal: Multilevel degenerative disc disease identified. No acute or suspicious osseous findings. Review of the MIP images confirms the above findings. IMPRESSION: Examination is positive for acute PE with CT evidence of right heart strain (RV/LV Ratio 1.2) consistent with at least submassive (intermediate risk) PE. The presence of right heart strain has been associated with an increased risk of morbidity and mortality. Critical Value/emergent results were called by telephone at the time of interpretation on 05/30/2020 at 5:06 pm to provider , PA , who verbally acknowledged these results. Electronically Signed   By: 14/07/2019 M.D.   On: 05/30/2020 17:07   DG Chest Portable 1 View  Result Date: 05/30/2020 CLINICAL DATA:  Shortness of breath, beginning approximately  1 month ago EXAM: PORTABLE CHEST 1 VIEW COMPARISON:  April 21, 2015 FINDINGS: Heart size is enlarged, likely increased size since 2016. Central pulmonary vascular engorgement. Lungs are clear.  No sign of effusion on frontal view. On limited assessment no acute skeletal process. IMPRESSION: Cardiomegaly with central pulmonary vascular engorgement. Heart size  increased compared to the prior exam. Correlate with any signs of heart failure or volume overload. No overt signs of pulmonary edema. Electronically Signed   By: Donzetta Kohut M.D.   On: 05/30/2020 15:13   ECHOCARDIOGRAM COMPLETE  Result Date: 05/31/2020    ECHOCARDIOGRAM REPORT   Patient Name:   ANUOLUWAPO MEFFERD [facility] Date of Exam: 05/31/2020 Medical Rec #:  [id number]        Height:       65.0 in Accession #:    [id number]       Weight:       296.3 lb Date of Birth:  [birth year]        BSA:          2.338 m Patient Age:    49 years         BP:           124/71 mmHg Patient Gender: F                HR:           88 bpm. Exam Location:  Inpatient Procedure: 2D Echo, Color Doppler and Cardiac Doppler Indications:    I26.02 Pulmonary embolus  History:        Patient has no prior history of Echocardiogram examinations.  Sonographer:    Irving Burton Senior RDCS Referring Phys: [id number]  Sonographer Comments: Poor apical window due to body habitus. IMPRESSIONS  1. Left ventricular ejection fraction, by estimation, is 60 to 65%. The left ventricle has normal function. The left ventricle has no regional wall motion abnormalities. Left ventricular diastolic parameters are consistent with Grade II diastolic dysfunction (pseudonormalization).  2. + McConnell's sign - c/w pulmonary embolus.     . Right ventricular systolic function is severely reduced. The right ventricular size is severely enlarged. There is severely elevated pulmonary artery systolic pressure. The estimated right ventricular systolic pressure is 77.7 mmHg.  3. Right atrial size was mildly dilated.  4. The mitral valve is normal in structure. No evidence of mitral valve regurgitation. No evidence of mitral stenosis.  5. The aortic valve is normal in structure. Aortic valve regurgitation is not visualized. No aortic stenosis is present.  6. There is a large saddle pulmonary embolus at the bifurcation of the main pulmonary artery. FINDINGS  Left Ventricle: Left  ventricular ejection fraction, by estimation, is 60 to 65%. The left ventricle has normal function. The left ventricle has no regional wall motion abnormalities. The left ventricular internal cavity size was normal in size. There is  no left ventricular hypertrophy. Left ventricular diastolic parameters are consistent with Grade II diastolic dysfunction (pseudonormalization). Right Ventricle: + McConnell's sign - c/w pulmonary embolus. The right ventricular size is severely enlarged. Right vetricular wall thickness was not well visualized. Right ventricular systolic function is severely reduced. There is severely elevated pulmonary artery systolic pressure. The tricuspid regurgitant velocity is 3.96 m/s, and with an assumed right atrial pressure of 15 mmHg, the estimated right ventricular systolic pressure is 77.7 mmHg. Left Atrium: Left atrial size was normal in size. Right Atrium: Right atrial size was mildly dilated. Pericardium: There is no evidence of pericardial  effusion. Mitral Valve: The mitral valve is normal in structure. No evidence of mitral valve regurgitation. No evidence of mitral valve stenosis. Tricuspid Valve: The tricuspid valve is normal in structure. Tricuspid valve regurgitation is mild . No evidence of tricuspid stenosis. Aortic Valve: The aortic valve is normal in structure. Aortic valve regurgitation is not visualized. No aortic stenosis is present. Pulmonic Valve: The pulmonic valve was grossly normal. Pulmonic valve regurgitation is not visualized. Aorta: The aortic root and ascending aorta are structurally normal, with no evidence of dilitation. Pulmonary Artery: There is a large saddle pulmonary embolus at the bifurcation of the main pulmonary artery. IAS/Shunts: The atrial septum is grossly normal.  LEFT VENTRICLE PLAX 2D LVIDd:         3.70 cm  Diastology LVIDs:         2.50 cm  LV e' medial:    4.03 cm/s LV PW:         1.50 cm  LV E/e' medial:  6.3 LV IVS:        1.20 cm  LV e'  lateral:   4.35 cm/s LVOT diam:     2.00 cm  LV E/e' lateral: 5.8 LV SV:         28 LV SV Index:   12 LVOT Area:     3.14 cm  RIGHT VENTRICLE RV S prime:     6.85 cm/s TAPSE (M-mode): 0.9 cm LEFT ATRIUM             Index       RIGHT ATRIUM           Index LA diam:        3.10 cm 1.33 cm/m  RA Area:     22.00 cm LA Vol (A2C):   37.0 ml 15.83 ml/m RA Volume:   74.20 ml  31.74 ml/m LA Vol (A4C):   43.3 ml 18.52 ml/m LA Biplane Vol: 41.8 ml 17.88 ml/m  AORTIC VALVE LVOT Vmax:   58.10 cm/s LVOT Vmean:  42.500 cm/s LVOT VTI:    0.090 m                          PULMONARY ARTERY AORTA                    MPA diam:        2.70 cm Ao Root diam: 2.80 cm Ao Asc diam:  3.40 cm MITRAL VALVE               TRICUSPID VALVE MV Area (PHT): 3.06 cm    TR Peak grad:   62.7 mmHg MV Decel Time: 248 msec    TR Vmax:        396.00 cm/s MV E velocity: 25.30 cm/s MV A velocity: 45.80 cm/s  SHUNTS MV E/A ratio:  0.55        Systemic VTI:  0.09 m                            Systemic Diam: 2.00 cm Kristeen Miss MD Electronically signed by Kristeen Miss MD Signature Date/Time: 05/31/2020/2:03:01 PM    Final    VAS [place] LOWER EXTREMITY VENOUS (DVT)  Result Date: 06/01/2020  Lower Venous DVT Study Indications: Pulmonary embolism.  Comparison Study: no prior Performing Technologist: Blanch Media RVS  Examination Guidelines: A complete evaluation includes B-mode imaging, spectral Doppler, color Doppler, and power  Doppler as needed of all accessible portions of each vessel. Bilateral testing is considered an integral part of a complete examination. Limited examinations for reoccurring indications may be performed as noted. The reflux portion of the exam is performed with the patient in reverse Trendelenburg.  +---------+---------------+---------+-----------+----------+--------------+ RIGHT    CompressibilityPhasicitySpontaneityPropertiesThrombus Aging +---------+---------------+---------+-----------+----------+--------------+ CFV       Full           Yes      Yes                                 +---------+---------------+---------+-----------+----------+--------------+ SFJ      Full                                                        +---------+---------------+---------+-----------+----------+--------------+ FV Prox  Full                                                        +---------+---------------+---------+-----------+----------+--------------+ FV Mid   Full                                                        +---------+---------------+---------+-----------+----------+--------------+ FV DistalFull                                                        +---------+---------------+---------+-----------+----------+--------------+ PFV      Full                                                        +---------+---------------+---------+-----------+----------+--------------+ POP      Full           Yes      Yes                                 +---------+---------------+---------+-----------+----------+--------------+ PTV      Full                                                        +---------+---------------+---------+-----------+----------+--------------+ PERO     Full                                                        +---------+---------------+---------+-----------+----------+--------------+   +---------+---------------+---------+-----------+----------+-------------------+  LEFT     CompressibilityPhasicitySpontaneityPropertiesThrombus Aging      +---------+---------------+---------+-----------+----------+-------------------+ CFV      Full           Yes      No                                       +---------+---------------+---------+-----------+----------+-------------------+ SFJ      Full                                                             +---------+---------------+---------+-----------+----------+-------------------+ FV Prox  Full                                                              +---------+---------------+---------+-----------+----------+-------------------+ FV Mid   Full                                                             +---------+---------------+---------+-----------+----------+-------------------+ FV DistalFull                                                             +---------+---------------+---------+-----------+----------+-------------------+ PFV      Full                                                             +---------+---------------+---------+-----------+----------+-------------------+ POP      None           No       No                   Age Indeterminate   +---------+---------------+---------+-----------+----------+-------------------+ PTV      None                                         Age Indeterminate   +---------+---------------+---------+-----------+----------+-------------------+ PERO                                                  Not well visualized +---------+---------------+---------+-----------+----------+-------------------+     Summary: RIGHT: - There is no evidence of deep vein thrombosis in the lower extremity.  - No cystic structure found in the popliteal fossa.  LEFT: - Findings consistent with age indeterminate deep vein  thrombosis involving the left popliteal vein, and left posterior tibial veins. - No cystic structure found in the popliteal fossa.  *See table(s) above for measurements and observations. Electronically signed by Coral Else MD on 06/01/2020 at 5:37:15 PM.    Final    ECHOCARDIOGRAM LIMITED  Result Date: 06/02/2020    ECHOCARDIOGRAM LIMITED REPORT   Patient Name:   PARVEEN FREEHLING [facility] Date of Exam: 06/02/2020 Medical Rec #:  [id number]        Height:       65.0 in Accession #:    [id number]       Weight:       297.2 lb Date of Birth:  March 15, 1971        BSA:          2.341 m Patient Age:    49 years         BP:            138/84 mmHg Patient Gender: F                HR:           85 bpm. Exam Location:  Inpatient Procedure: Limited Echo Indications:    pulmonary embolus  History:        Patient has prior history of Echocardiogram examinations, most                 recent 05/31/2020.  Sonographer:    Celene Skeen RDCS (AE) Referring Phys: [id number] [facility] M Brinden Kincheloe  Sonographer Comments: Suboptimal apical window and patient is morbidly obese. Image acquisition challenging due to patient body habitus. IMPRESSIONS  1. No significant change in the appearance or function of the right ventricle compared to 05/31/20. RV size and function may have improved slightly. Right ventricular systolic function is moderately reduced. The right ventricular size is moderately enlarged. Tricuspid regurgitation signal is inadequate for assessing PA pressure.  2. Left ventricular ejection fraction, by estimation, is 60 to 65%. The left ventricle has normal function. There is the interventricular septum is flattened in systole, consistent with right ventricular pressure overload.  3. The aortic valve is tricuspid.  4. The inferior vena cava is normal in size with greater than 50% respiratory variability, suggesting right atrial pressure of 3 mmHg.  5. PA bifurcation not well visualized on this study. FINDINGS  Left Ventricle: Left ventricular ejection fraction, by estimation, is 60 to 65%. The left ventricle has normal function. The interventricular septum is flattened in systole, consistent with right ventricular pressure overload. Right Ventricle: No significant change in the appearance or function of the right ventricle compared to 05/31/20. RV size and function may have improved slightly. The right ventricular size is moderately enlarged. Right ventricular systolic function is moderately reduced. Tricuspid regurgitation signal is inadequate for assessing PA pressure. Aortic Valve: The aortic valve is tricuspid. Pulmonic Valve: The pulmonic valve was normal in  structure. Pulmonic valve regurgitation is trivial. Pulmonary Artery: PA bifurcation not well visualized on this study. Venous: The inferior vena cava is normal in size with greater than 50% respiratory variability, suggesting right atrial pressure of 3 mmHg. IAS/Shunts: The interatrial septum was not well visualized. Weston Brass MD Electronically signed by Weston Brass MD Signature Date/Time: 06/02/2020/5:17:05 PM    Final     Microbiology: Recent Results (from the past 240 hour(s))  Resp Panel by RT-PCR (Flu A&B, Covid) Nasopharyngeal Swab     Status: None   Collection Time: 05/30/20  3:30 PM   Specimen:  Nasopharyngeal Swab; Nasopharyngeal(NP) swabs in vial transport medium  Result Value Ref Range Status   SARS Coronavirus 2 by RT PCR NEGATIVE NEGATIVE Final    Comment: (NOTE) SARS-CoV-2 target nucleic acids are NOT DETECTED.  The SARS-CoV-2 RNA is generally detectable in upper respiratory specimens during the acute phase of infection. The lowest concentration of SARS-CoV-2 viral copies this assay can detect is 138 copies/mL. A negative result does not preclude SARS-Cov-2 infection and should not be used as the sole basis for treatment or other patient management decisions. A negative result may occur with  improper specimen collection/handling, submission of specimen other than nasopharyngeal swab, presence of viral mutation(s) within the areas targeted by this assay, and inadequate number of viral copies(<138 copies/mL). A negative result must be combined with clinical observations, patient history, and epidemiological information. The expected result is Negative.  Fact Sheet for Patients:  BloggerCourse.comhttps://www.fda.gov/media/152166/download  Fact Sheet for Healthcare Providers:  SeriousBroker.ithttps://www.fda.gov/media/152162/download  This test is no t yet approved or cleared by the [place] FDA and  has been authorized for detection and/or diagnosis of SARS-CoV-2 by FDA under an Emergency  Use Authorization (EUA). This EUA will remain  in effect (meaning this test can be used) for the duration of the COVID-19 declaration under Section 564(b)(1) of the Act, 21 U.S.C.section 360bbb-3(b)(1), unless the authorization is terminated  or revoked sooner.       Influenza A by PCR NEGATIVE NEGATIVE Final   Influenza B by PCR NEGATIVE NEGATIVE Final    Comment: (NOTE) The Xpert Xpress SARS-CoV-2/FLU/RSV plus assay is intended as an aid in the diagnosis of influenza from Nasopharyngeal swab specimens and should not be used as a sole basis for treatment. Nasal washings and aspirates are unacceptable for Xpert Xpress SARS-CoV-2/FLU/RSV testing.  Fact Sheet for Patients: BloggerCourse.comhttps://www.fda.gov/media/152166/download  Fact Sheet for Healthcare Providers: SeriousBroker.ithttps://www.fda.gov/media/152162/download  This test is not yet approved or cleared by the [place] FDA and has been authorized for detection and/or diagnosis of SARS-CoV-2 by FDA under an Emergency Use Authorization (EUA). This EUA will remain in effect (meaning this test can be used) for the duration of the COVID-19 declaration under Section 564(b)(1) of the Act, 21 U.S.C. section 360bbb-3(b)(1), unless the authorization is terminated or revoked.  Performed at [facility] [place], [address] [place], [place] [address]   MRSA PCR Screening     Status: None   Collection Time: 05/30/20  9:45 PM   Specimen: Nasal Mucosa; Nasopharyngeal  Result Value Ref Range Status   MRSA by PCR NEGATIVE NEGATIVE Final    Comment:        The GeneXpert MRSA Assay (FDA approved for NASAL specimens only), is one component of a comprehensive MRSA colonization surveillance program. It is not intended to diagnose MRSA infection nor to guide or monitor treatment for MRSA infections. Performed at [facility], [address] [place], [place] [address]      Labs: CBC: Recent Labs  Lab 05/30/20 1520  05/30/20 1721 05/31/20 1730 05/31/20 1849 06/01/20 0500 06/02/20 0535 06/03/20 0320  WBC 6.4   < > 7.2 6.3 6.2 5.6 5.0  NEUTROABS 3.4  --   --   --   --   --   --   HGB 14.5   < > 14.1 12.9 12.7 13.0 13.0  HCT 44.1   < > 44.3 40.4 39.1 40.1 40.6  MCV 89.8   < > 91.5 92.2 91.4 90.5 91.9  PLT 216   < >  216 203 162 200 204   < > = values in this interval not displayed.   Basic Metabolic Panel: Recent Labs  Lab 05/31/20 0042 06/01/20 0500 06/02/20 0535 06/03/20 0320 06/04/20 0446  NA 136 137 137 137 136  K 3.7 3.3* 3.7 3.5 3.6  CL 107 105 104 103 102  CO2 17* 21* 24 23 26   GLUCOSE 200* 131* 133* 133* 135*  BUN 11 15 11 12 15   CREATININE 1.20* 1.10* 0.97 1.02* 0.91  CALCIUM 8.8* 8.5* 8.7* 9.0 9.0   Liver Function Tests: Recent Labs  Lab 05/30/20 1520  AST 64*  ALT 96*  ALKPHOS 97  BILITOT 1.2  PROT 7.3  ALBUMIN 3.6   CBG: No results for input(s): GLUCAP in the last 168 hours.  Time spent: 35 minutes  Signed:   [facility] 06/04/2020 12:56 AM

## 2020-06-07 LAB — LUPUS ANTICOAGULANT PANEL
DRVVT: 59.3 s — ABNORMAL HIGH (ref 0.0–47.0)
PTT Lupus Anticoagulant: 44.5 s (ref 0.0–51.9)

## 2020-06-07 LAB — DRVVT CONFIRM: dRVVT Confirm: 1.1 ratio (ref 0.8–1.2)

## 2020-06-07 LAB — DRVVT MIX: dRVVT Mix: 44 s — ABNORMAL HIGH (ref 0.0–40.4)

## 2020-06-07 NOTE — Discharge Summary (Signed)
[facility] Discharge Summary   Patient: [name] WJX:[id number]  PCP: Harvie Heck, MD  Date of admission: 05/30/2020   Date of discharge: 06/04/2020     Discharge Diagnoses:  Principal Problem:   Acute pulmonary embolism (HCC) Active Problems:   Elevated troponin   Admitted From: home Disposition:  Home   Recommendations for Outpatient Follow-up:  1. [id number]:[id number] follow up with PCP in 1 week 2. Follow up LABS/TEST:  none   Follow-up Information    Oretha Milch, MD Follow up on 07/09/2020.   Specialty: Pulmonary Disease Why: Appt at 10:00 AM.  Please arrive at 9:45 AM for check in. Contact information: [address] [place] [address] [phone]        Harvie Heck, MD. Schedule an appointment as soon as possible for a visit in 1 week(s).   Specialty: Family Medicine Why: cbc and bmp in 2 weeks.  Contact information: [address] [place] [address] [phone]              Diet recommendation: Cardiac diet  Activity: The patient is advised to gradually reintroduce usual activities, as tolerated  Discharge Condition: stable  Code Status: Full code   History of present illness: As per the H and P dictated on admission, "[name] is a 49 y.o. female with medical history significant for obesity who presented to med [facility] ED for evaluation of shortness of breath.  Patient states she has been having approximately 1 month of exertional shortness of breath.  She initially felt this was related to seasonal allergies due to symptoms beginning after exposure to pollen.  About 2 weeks ago she was treated with Medrol Dosepak and albuterol inhaler without significant improvement.  Morning of 05/30/2020 she had significantly worsened shortness of breath which was occurring even while she was at rest.  She has been having a nonproductive cough.  She says last night she was having alternating  chills and diaphoresis.  She has not had any chest pain, nausea, vomiting, abdominal pain, dysuria.  She has not noticed any swelling in her lower extremities.  She does say that she was having cramping in her legs about 5 months ago which have since resolved after starting magnesium supplementation.  She says she works at home and does spend 8-9 hours sitting down during workdays.  She denies any history of tobacco use.  She does not use any estrogen containing products or OCPs.  She denies any recent travel or surgeries.  She denies any personal history of blood clots.  She is not aware of any history of blood clots in her family. "  Hospital Course:  Summary of her active problems in the hospital is as following. 1. Acute saddle pulmonary embolism. Right heart strain. Elevated troponin. Acute hypoxic respiratory failure. Resolved Pulmonary hypertension Currently onroom air CT scan shows evidence of large saddle pulmonary embolism. Initially started on IV heparin. Lower extremity Doppler also positive for DVT. Given the size of the PE, elevation of the troponin as well as severe RV dysfunction seen on the echocardiogram PCCM was consulted. Highly appreciate their assistance. Understandably patient remains at high risk for catheter guided thrombolysis given her poorRV. Tolerated TPA very well. Repeat echocardiogram on 06/02/2020 shows mild improvement in RV failure. CT PE protocol was repeated on 12/6 which shows significant improvement in the clot burden and no RV strain. At present no further plan for inpatient right heart cath. Plan  is for outpatient follow-up echocardiogram and if the echocardiogram continues to show improvement no further work-up for right heart failure. Discussed options for anticoagulation with patient at length including warfarin and DOAC. Patient currently willing to take DOAC.  Based on insurance currently she wants to switch to Xarelto.  Risk benefit all  discussed at length.  2. Morbid obesity Body mass index is 48.5 kg/m.  Placing the patient at high risk for poor outcomes. We will monitor. Patient will benefit from outpatient dietary consultation.  3. Hypercoagulability Etiology of patient's PE is not clear. She does not have any immobilization history not she has any medication history that can increase risk for PE. 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 Hypercoagulable panel Antithrombin III 88 normal, total protein C 85% normal, beta-2 glycoprotein normal, serum homocystine normal Pending labs protein C activity, protein S activity, protein S total, lupus anticoagulant, factor V Leiden, prothrombin gene mutation, cardiolipin antibody.  4.Essential hypertension  blood pressure elevated. Also has lower extremity edema. Continue hydrochlorothiazide  5.Hypokalemia. Replacing.   6.  Needs sleep apnea assessment. Family member at bedside reports patient has been snoring and occasionally stops breathing. Patient denies having any complaints of early morning headaches, but reports daytime sleepiness. Likely partially responsible for patient's right heart failure as well. Will benefit from outpatient sleep study.  Patient was ambulatory without any assistance. On the day of the discharge the patient's vitals were stable, and no other acute medical condition were reported by patient. The patient was felt safe to be discharge at Home with no therapy needed on discharge.  Consultants: PCCM  Procedures: Echocardiogram  TPA  Discharge Exam: General: Appear in no distress, no Rash; Oral Mucosa Clear, moist. no Abnormal Neck Mass Or lumps, Conjunctiva normal  Cardiovascular: S1 and S2 Present, no Murmur Respiratory: good respiratory effort, Bilateral Air entry present and CTA, no Crackles, no wheezes Abdomen: Bowel Sound present, Soft and no  tenderness Extremities: no Pedal edema Neurology: alert and oriented to time, place, and person affect appropriate. no new focal deficit  Filed Weights   06/02/20 0500 06/03/20 0500 06/04/20 0509  Weight: 134.8 kg 132.2 kg 131.5 kg   Vitals:   06/04/20 1015 06/04/20 1413  BP: 135/89 125/84  Pulse: 85 95  Resp: 18 15  Temp: 97.8 F (36.6 C) (!) 97.5 F (36.4 C)  SpO2: 96% 98%    DISCHARGE MEDICATION: Allergies as of 06/04/2020      Reactions   Sulfa Antibiotics Rash      Medication List    TAKE these medications   fluticasone 50 MCG/ACT nasal spray Commonly known as: FLONASE Place 1 spray into both nostrils daily.   hydrochlorothiazide 25 MG tablet Commonly known as: HYDRODIURIL Take 1 tablet (25 mg total) by mouth daily.   Magnesium 250 MG Tabs Take 1 tablet by mouth daily.   multivitamin with minerals Tabs tablet Take 1 tablet by mouth daily.   Rivaroxaban Stater Pack (15 mg and 20 mg) Commonly known as: XARELTO STARTER PACK Follow package directions: Take one 15mg  tablet by mouth twice a day. On day 22, 06/24/2020 switch to one 20mg  tablet once a day. Take with food.      Allergies  Allergen Reactions  . Sulfa Antibiotics Rash   Discharge Instructions    Ambulatory referral to Sleep Studies   Complete by: As directed    Diet - low sodium heart healthy  Complete by: As directed       The results of significant diagnostics from this hospitalization (including imaging, microbiology, ancillary and laboratory) are listed below for reference.    Significant Diagnostic Studies: CT ANGIO CHEST PE W OR WO CONTRAST  Result Date: 06/03/2020 CLINICAL DATA:  Follow-up pulmonary embolism clot bird EXAM: CT ANGIOGRAPHY CHEST WITH CONTRAST TECHNIQUE: Multidetector CT imaging of the chest was performed using the standard protocol during bolus administration of intravenous contrast. Multiplanar CT image reconstructions and MIPs were obtained to evaluate the vascular  anatomy. CONTRAST:  OMNIPAQUE IOHEXOL 350 MG/ML SOLN COMPARISON:  Four days ago FINDINGS: Cardiovascular: On prior there was central pulmonary emboli with saddle clot. The saddle embolus has resolved and there is decreasing main and lobar clot on both sides. The right heart and pulmonary outflow track appear less distended. No pericardial effusion or cardiomegaly. Mediastinum/Nodes: Negative Lungs/Pleura: No infarct or failure.  No effusion or pneumothorax. Upper Abdomen: Cholecystectomy clips. Musculoskeletal: Spondylosis. Review of the MIP images confirms the above findings. IMPRESSION: Significantly decreased clot burden. No evidence of interval emboli or right heart failure. Electronically Signed   By: Marnee Spring M.D.   On: 06/03/2020 10:47   CT Angio Chest PE W and/or Wo Contrast  Result Date: 05/30/2020 CLINICAL DATA:  Evaluate for pulmonary embolus. EXAM: CT ANGIOGRAPHY CHEST WITH CONTRAST TECHNIQUE: Multidetector CT imaging of the chest was performed using the standard protocol during bolus administration of intravenous contrast. Multiplanar CT image reconstructions and MIPs were obtained to evaluate the vascular anatomy. CONTRAST:  OMNIPAQUE IOHEXOL 350 MG/ML SOLN COMPARISON:  None. FINDINGS: Cardiovascular: Examination is positive for large central obstructing pulmonary embolus with saddle configuration, image 33/4. Near occlusive emboli are identified extending into the right upper lobe, right lower lobe, left upper lobe, and left lower lobe pulmonary arteries. There is CT evidence of right heart strain (RV/LV Ratio = 1.2). Mediastinum/Nodes: No enlarged mediastinal, hilar, or axillary lymph nodes. Thyroid gland, trachea, and esophagus demonstrate no significant findings. Lungs/Pleura: Lungs are clear. No pleural effusion or edema. No airspace consolidation identified. Upper Abdomen: No acute abnormality. Musculoskeletal: Multilevel degenerative disc disease identified. No acute or  suspicious osseous findings. Review of the MIP images confirms the above findings. IMPRESSION: Examination is positive for acute PE with CT evidence of right heart strain (RV/LV Ratio 1.2) consistent with at least submassive (intermediate risk) PE. The presence of right heart strain has been associated with an increased risk of morbidity and mortality. Critical Value/emergent results were called by telephone at the time of interpretation on 05/30/2020 at 5:06 pm to provider Placido Sou, PA , who verbally acknowledged these results. Electronically Signed   By: Signa Kell M.D.   On: 05/30/2020 17:07   DG Chest Portable 1 View  Result Date: 05/30/2020 CLINICAL DATA:  Shortness of breath, beginning approximately 1 month ago EXAM: PORTABLE CHEST 1 VIEW COMPARISON:  April 21, 2015 FINDINGS: Heart size is enlarged, likely increased size since 2016. Central pulmonary vascular engorgement. Lungs are clear.  No sign of effusion on frontal view. On limited assessment no acute skeletal process. IMPRESSION: Cardiomegaly with central pulmonary vascular engorgement. Heart size increased compared to the prior exam. Correlate with any signs of heart failure or volume overload. No overt signs of pulmonary edema. Electronically Signed   By: Donzetta Kohut M.D.   On: 05/30/2020 15:13   ECHOCARDIOGRAM COMPLETE  Result Date: 05/31/2020    ECHOCARDIOGRAM REPORT   Patient Name:   MIANA POLITTE [facility] Date  of Exam: 05/31/2020 Medical Rec #:  [id number]        Height:       65.0 in Accession #:    [id number]       Weight:       296.3 lb Date of Birth:  11-18-1970        BSA:          2.338 m Patient Age:    49 years         BP:           124/71 mmHg Patient Gender: F                HR:           88 bpm. Exam Location:  Inpatient Procedure: 2D Echo, Color Doppler and Cardiac Doppler Indications:    I26.02 Pulmonary embolus  History:        Patient has no prior history of Echocardiogram examinations.  Sonographer:    Irving BurtonEmily Senior  RDCS Referring Phys: [id number] Tyrone NineYAN B GRUNZ  Sonographer Comments: Poor apical window due to body habitus. IMPRESSIONS  1. Left ventricular ejection fraction, by estimation, is 60 to 65%. The left ventricle has normal function. The left ventricle has no regional wall motion abnormalities. Left ventricular diastolic parameters are consistent with Grade II diastolic dysfunction (pseudonormalization).  2. + McConnell's sign - c/w pulmonary embolus.     . Right ventricular systolic function is severely reduced. The right ventricular size is severely enlarged. There is severely elevated pulmonary artery systolic pressure. The estimated right ventricular systolic pressure is 77.7 mmHg.  3. Right atrial size was mildly dilated.  4. The mitral valve is normal in structure. No evidence of mitral valve regurgitation. No evidence of mitral stenosis.  5. The aortic valve is normal in structure. Aortic valve regurgitation is not visualized. No aortic stenosis is present.  6. There is a large saddle pulmonary embolus at the bifurcation of the main pulmonary artery. FINDINGS  Left Ventricle: Left ventricular ejection fraction, by estimation, is 60 to 65%. The left ventricle has normal function. The left ventricle has no regional wall motion abnormalities. The left ventricular internal cavity size was normal in size. There is  no left ventricular hypertrophy. Left ventricular diastolic parameters are consistent with Grade II diastolic dysfunction (pseudonormalization). Right Ventricle: + McConnell's sign - c/w pulmonary embolus. The right ventricular size is severely enlarged. Right vetricular wall thickness was not well visualized. Right ventricular systolic function is severely reduced. There is severely elevated pulmonary artery systolic pressure. The tricuspid regurgitant velocity is 3.96 m/s, and with an assumed right atrial pressure of 15 mmHg, the estimated right ventricular systolic pressure is 77.7 mmHg. Left Atrium: Left  atrial size was normal in size. Right Atrium: Right atrial size was mildly dilated. Pericardium: There is no evidence of pericardial effusion. Mitral Valve: The mitral valve is normal in structure. No evidence of mitral valve regurgitation. No evidence of mitral valve stenosis. Tricuspid Valve: The tricuspid valve is normal in structure. Tricuspid valve regurgitation is mild . No evidence of tricuspid stenosis. Aortic Valve: The aortic valve is normal in structure. Aortic valve regurgitation is not visualized. No aortic stenosis is present. Pulmonic Valve: The pulmonic valve was grossly normal. Pulmonic valve regurgitation is not visualized. Aorta: The aortic root and ascending aorta are structurally normal, with no evidence of dilitation. Pulmonary Artery: There is a large saddle pulmonary embolus at the bifurcation of the main pulmonary artery. IAS/Shunts: The atrial septum is  grossly normal.  LEFT VENTRICLE PLAX 2D LVIDd:         3.70 cm  Diastology LVIDs:         2.50 cm  LV e' medial:    4.03 cm/s LV PW:         1.50 cm  LV E/e' medial:  6.3 LV IVS:        1.20 cm  LV e' lateral:   4.35 cm/s LVOT diam:     2.00 cm  LV E/e' lateral: 5.8 LV SV:         28 LV SV Index:   12 LVOT Area:     3.14 cm  RIGHT VENTRICLE RV S prime:     6.85 cm/s TAPSE (M-mode): 0.9 cm LEFT ATRIUM             Index       RIGHT ATRIUM           Index LA diam:        3.10 cm 1.33 cm/m  RA Area:     22.00 cm LA Vol (A2C):   37.0 ml 15.83 ml/m RA Volume:   74.20 ml  31.74 ml/m LA Vol (A4C):   43.3 ml 18.52 ml/m LA Biplane Vol: 41.8 ml 17.88 ml/m  AORTIC VALVE LVOT Vmax:   58.10 cm/s LVOT Vmean:  42.500 cm/s LVOT VTI:    0.090 m                          PULMONARY ARTERY AORTA                    MPA diam:        2.70 cm Ao Root diam: 2.80 cm Ao Asc diam:  3.40 cm MITRAL VALVE               TRICUSPID VALVE MV Area (PHT): 3.06 cm    TR Peak grad:   62.7 mmHg MV Decel Time: 248 msec    TR Vmax:        396.00 cm/s MV E velocity: 25.30 cm/s  MV A velocity: 45.80 cm/s  SHUNTS MV E/A ratio:  0.55        Systemic VTI:  0.09 m                            Systemic Diam: 2.00 cm Kristeen Miss MD Electronically signed by Kristeen Miss MD Signature Date/Time: 05/31/2020/2:03:01 PM    Final    VAS [place] LOWER EXTREMITY VENOUS (DVT)  Result Date: 06/01/2020  Lower Venous DVT Study Indications: Pulmonary embolism.  Comparison Study: no prior Performing Technologist: Blanch Media RVS  Examination Guidelines: A complete evaluation includes B-mode imaging, spectral Doppler, color Doppler, and power Doppler as needed of all accessible portions of each vessel. Bilateral testing is considered an integral part of a complete examination. Limited examinations for reoccurring indications may be performed as noted. The reflux portion of the exam is performed with the patient in reverse Trendelenburg.  +---------+---------------+---------+-----------+----------+--------------+ RIGHT    CompressibilityPhasicitySpontaneityPropertiesThrombus Aging +---------+---------------+---------+-----------+----------+--------------+ CFV      Full           Yes      Yes                                 +---------+---------------+---------+-----------+----------+--------------+ SFJ  Full                                                        +---------+---------------+---------+-----------+----------+--------------+ FV Prox  Full                                                        +---------+---------------+---------+-----------+----------+--------------+ FV Mid   Full                                                        +---------+---------------+---------+-----------+----------+--------------+ FV DistalFull                                                        +---------+---------------+---------+-----------+----------+--------------+ PFV      Full                                                         +---------+---------------+---------+-----------+----------+--------------+ POP      Full           Yes      Yes                                 +---------+---------------+---------+-----------+----------+--------------+ PTV      Full                                                        +---------+---------------+---------+-----------+----------+--------------+ PERO     Full                                                        +---------+---------------+---------+-----------+----------+--------------+   +---------+---------------+---------+-----------+----------+-------------------+ LEFT     CompressibilityPhasicitySpontaneityPropertiesThrombus Aging      +---------+---------------+---------+-----------+----------+-------------------+ CFV      Full           Yes      No                                       +---------+---------------+---------+-----------+----------+-------------------+ SFJ      Full                                                             +---------+---------------+---------+-----------+----------+-------------------+  FV Prox  Full                                                             +---------+---------------+---------+-----------+----------+-------------------+ FV Mid   Full                                                             +---------+---------------+---------+-----------+----------+-------------------+ FV DistalFull                                                             +---------+---------------+---------+-----------+----------+-------------------+ PFV      Full                                                             +---------+---------------+---------+-----------+----------+-------------------+ POP      None           No       No                   Age Indeterminate   +---------+---------------+---------+-----------+----------+-------------------+ PTV      None                                          Age Indeterminate   +---------+---------------+---------+-----------+----------+-------------------+ PERO                                                  Not well visualized +---------+---------------+---------+-----------+----------+-------------------+     Summary: RIGHT: - There is no evidence of deep vein thrombosis in the lower extremity.  - No cystic structure found in the popliteal fossa.  LEFT: - Findings consistent with age indeterminate deep vein thrombosis involving the left popliteal vein, and left posterior tibial veins. - No cystic structure found in the popliteal fossa.  *See table(s) above for measurements and observations. Electronically signed by Coral Else MD on 06/01/2020 at 5:37:15 PM.    Final    ECHOCARDIOGRAM LIMITED  Result Date: 06/02/2020    ECHOCARDIOGRAM LIMITED REPORT   Patient Name:   SHERYLL DYMEK [facility] Date of Exam: 06/02/2020 Medical Rec #:  [id number]        Height:       65.0 in Accession #:    [id number]       Weight:       297.2 lb Date of Birth:  12-02-1970        BSA:          2.341 m Patient Age:    83 years  BP:           138/84 mmHg Patient Gender: F                HR:           85 bpm. Exam Location:  Inpatient Procedure: Limited Echo Indications:    pulmonary embolus  History:        Patient has prior history of Echocardiogram examinations, most                 recent 05/31/2020.  Sonographer:    Celene Skeen RDCS (AE) Referring Phys: [id number] [facility] M Laelah Siravo  Sonographer Comments: Suboptimal apical window and patient is morbidly obese. Image acquisition challenging due to patient body habitus. IMPRESSIONS  1. No significant change in the appearance or function of the right ventricle compared to 05/31/20. RV size and function may have improved slightly. Right ventricular systolic function is moderately reduced. The right ventricular size is moderately enlarged. Tricuspid regurgitation signal is inadequate for assessing PA pressure.  2.  Left ventricular ejection fraction, by estimation, is 60 to 65%. The left ventricle has normal function. There is the interventricular septum is flattened in systole, consistent with right ventricular pressure overload.  3. The aortic valve is tricuspid.  4. The inferior vena cava is normal in size with greater than 50% respiratory variability, suggesting right atrial pressure of 3 mmHg.  5. PA bifurcation not well visualized on this study. FINDINGS  Left Ventricle: Left ventricular ejection fraction, by estimation, is 60 to 65%. The left ventricle has normal function. The interventricular septum is flattened in systole, consistent with right ventricular pressure overload. Right Ventricle: No significant change in the appearance or function of the right ventricle compared to 05/31/20. RV size and function may have improved slightly. The right ventricular size is moderately enlarged. Right ventricular systolic function is moderately reduced. Tricuspid regurgitation signal is inadequate for assessing PA pressure. Aortic Valve: The aortic valve is tricuspid. Pulmonic Valve: The pulmonic valve was normal in structure. Pulmonic valve regurgitation is trivial. Pulmonary Artery: PA bifurcation not well visualized on this study. Venous: The inferior vena cava is normal in size with greater than 50% respiratory variability, suggesting right atrial pressure of 3 mmHg. IAS/Shunts: The interatrial septum was not well visualized. Weston Brass MD Electronically signed by Weston Brass MD Signature Date/Time: 06/02/2020/5:17:05 PM    Final     Microbiology: Recent Results (from the past 240 hour(s))  Resp Panel by RT-PCR (Flu A&B, Covid) Nasopharyngeal Swab     Status: None   Collection Time: 05/30/20  3:30 PM   Specimen: Nasopharyngeal Swab; Nasopharyngeal(NP) swabs in vial transport medium  Result Value Ref Range Status   SARS Coronavirus 2 by RT PCR NEGATIVE NEGATIVE Final    Comment: (NOTE) SARS-CoV-2 target  nucleic acids are NOT DETECTED.  The SARS-CoV-2 RNA is generally detectable in upper respiratory specimens during the acute phase of infection. The lowest concentration of SARS-CoV-2 viral copies this assay can detect is 138 copies/mL. A negative result does not preclude SARS-Cov-2 infection and should not be used as the sole basis for treatment or other patient management decisions. A negative result may occur with  improper specimen collection/handling, submission of specimen other than nasopharyngeal swab, presence of viral mutation(s) within the areas targeted by this assay, and inadequate number of viral copies(<138 copies/mL). A negative result must be combined with clinical observations, patient history, and epidemiological information. The expected result is Negative.  Fact Sheet for Patients:  BloggerCourse.com  Fact Sheet for Healthcare Providers:  [employer]ious[employer]ker[employer]  This test is no t yet approved or cleared by the [place] FDA and  has been authorized for detection and/or diagnosis of SARS-CoV-2 by FDA under an Emergency Use Authorization (EUA). This EUA will remain  in effect (meaning this test can be used) for the duration of the COVID-19 declaration under Section 564(b)(1) of the Act, 21 U.S.C.section 360bbb-3(b)(1), unless the authorization is terminated  or revoked sooner.       Influenza A by PCR NEGATIVE NEGATIVE Final   Influenza B by PCR NEGATIVE NEGATIVE Final    Comment: (NOTE) The Xpert Xpress SARS-CoV-2/FLU/RSV plus assay is intended as an aid in the diagnosis of influenza from Nasopharyngeal swab specimens and should not be used as a sole basis for treatment. Nasal washings and aspirates are unacceptable for Xpert Xpress SARS-CoV-2/FLU/RSV testing.  Fact Sheet for Patients: BloggerCourse.com  Fact Sheet for Healthcare  Providers: [employer]iousBroker[employer]it  This test is not yet approved or cleared by the [place] FDA and has been authorized for detection and/or diagnosis of SARS-CoV-2 by FDA under an Emergency Use Authorization (EUA). This EUA will remain in effect (meaning this test can be used) for the duration of the COVID-19 declaration under Section 564(b)(1) of the Act, 21 U.S.C. section 360bbb-3(b)(1), unless the authorization is terminated or revoked.  Performed at [facility], [address] [place], [place] [address]   MRSA PCR Screening     Status: None   Collection Time: 05/30/20  9:45 PM   Specimen: Nasal Mucosa; Nasopharyngeal  Result Value Ref Range Status   MRSA by PCR NEGATIVE NEGATIVE Final    Comment:        The GeneXpert MRSA Assay (FDA approved for NASAL specimens only), is one component of a comprehensive MRSA colonization surveillance program. It is not intended to diagnose MRSA infection nor to guide or monitor treatment for MRSA infections. Performed at [facility], [address] [place], [place] [address]      Labs: CBC: Recent Labs  Lab 05/31/20 1730 05/31/20 1849 06/01/20 0500 06/02/20 0535 06/03/20 0320  WBC 7.2 6.3 6.2 5.6 5.0  HGB 14.1 12.9 12.7 13.0 13.0  HCT 44.3 40.4 39.1 40.1 40.6  MCV 91.5 92.2 91.4 90.5 91.9  PLT 216 203 162 200 204   Basic Metabolic Panel: Recent Labs  Lab 06/01/20 0500 06/02/20 0535 06/03/20 0320 06/04/20 0446  NA 137 137 137 136  K 3.3* 3.7 3.5 3.6  CL 105 104 103 102  CO2 21* 24 23 26   GLUCOSE 131* 133* 133* 135*  BUN 15 11 12 15   CREATININE 1.10* 0.97 1.02* 0.91  CALCIUM 8.5* 8.7* 9.0 9.0   Liver Function Tests: No results for input(s): AST, ALT, ALKPHOS, BILITOT, PROT, ALBUMIN in the last 168 hours. CBG: No results for input(s): GLUCAP in the last 168 hours.  Time spent: 35 minutes  Signed:   [facility] 06/04/2020

## 2020-06-11 DIAGNOSIS — Z09 Encounter for follow-up examination after completed treatment for conditions other than malignant neoplasm: Secondary | ICD-10-CM | POA: Diagnosis not present

## 2020-06-11 DIAGNOSIS — R1031 Right lower quadrant pain: Secondary | ICD-10-CM | POA: Diagnosis not present

## 2020-06-11 DIAGNOSIS — R7303 Prediabetes: Secondary | ICD-10-CM | POA: Diagnosis not present

## 2020-06-11 DIAGNOSIS — G8929 Other chronic pain: Secondary | ICD-10-CM | POA: Diagnosis not present

## 2020-06-11 DIAGNOSIS — Z8742 Personal history of other diseases of the female genital tract: Secondary | ICD-10-CM | POA: Diagnosis not present

## 2020-06-11 DIAGNOSIS — I2692 Saddle embolus of pulmonary artery without acute cor pulmonale: Secondary | ICD-10-CM | POA: Diagnosis not present

## 2020-06-11 DIAGNOSIS — E876 Hypokalemia: Secondary | ICD-10-CM | POA: Diagnosis not present

## 2020-06-11 DIAGNOSIS — Z90710 Acquired absence of both cervix and uterus: Secondary | ICD-10-CM | POA: Diagnosis not present

## 2020-07-09 ENCOUNTER — Other Ambulatory Visit: Payer: Self-pay

## 2020-07-09 ENCOUNTER — Ambulatory Visit (INDEPENDENT_AMBULATORY_CARE_PROVIDER_SITE_OTHER): Payer: Federal, State, Local not specified - PPO | Admitting: Pulmonary Disease

## 2020-07-09 ENCOUNTER — Encounter: Payer: Self-pay | Admitting: Pulmonary Disease

## 2020-07-09 VITALS — BP 132/82 | HR 97 | Temp 97.8°F | Ht 65.0 in | Wt 299.0 lb

## 2020-07-09 DIAGNOSIS — G4733 Obstructive sleep apnea (adult) (pediatric): Secondary | ICD-10-CM | POA: Insufficient documentation

## 2020-07-09 DIAGNOSIS — I2692 Saddle embolus of pulmonary artery without acute cor pulmonale: Secondary | ICD-10-CM | POA: Diagnosis not present

## 2020-07-09 NOTE — Assessment & Plan Note (Signed)
Saddle PE, follow-up CTA showed decreased clot burden Left lower extremity DVT Appears to be tolerating Xarelto very well Appears unprovoked, no risk factor apparent will likely need lifelong anticoagulation Limited hypercoagulable work-up was negative, no family history of thrombophilias We will schedule follow-up echo in 3 months

## 2020-07-09 NOTE — Patient Instructions (Signed)
Schedule echocardiogram in March to recheck right-sided pressures. Schedule home sleep test

## 2020-07-09 NOTE — Progress Notes (Signed)
Subjective:    Patient ID: [name], female    DOB: January 04, 1971, 50 y.o.   MRN: [id number]  HPI  50 year old obese woman presents after hospitalisation for saddle PE for evaluation of sleep disordered breathing. She was admitted 12/2 to 06/04/2020 with saddle PE.  She presented with shortness of breath for a month.  Echo showed RV failure, received tPA, venous duplex showed left calf DVT, limited hypercoagulable work-up was negative. 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  She had a hysterectomy in 2008.  No family history of thrombophilias.  Daughter reports loud snoring but she denies hypersomnolence.  Occasional gasping and choking episodes have been noted by daughter in her sleep. Epworth sleepiness score is 1. Bedtime is between 930 and 10 PM, sleep latency is variable and sometimes can be up to 1 hour, she sleeps on her right side with 2 pillows, reports 1-2 nocturnal awakenings and is out of bed by 6:45 AM feeling rested without dryness of mouth or headaches There is no history suggestive of cataplexy, sleep paralysis or parasomnias She denies excessive caffeinated beverages   Significant tests/ events reviewed  CT chest angiogram 05/30/20 -saddle PE, occlusive emboli and right upper lobe, right lower lobe left upper and left lower lobes, RV/LV ratio 1.2  Echo 12/2 RVSP 78  12/6 CT angiogram decreased clot burden Follow-up limited echo 12/5 unable to assess RV Venous duplex, left popliteal DVT    Past Medical History:  Diagnosis Date  . Allergic rhinitis   . Cellulitis and abscess   . H/O blood clots   . Hypertension    Past Surgical History:  Procedure Laterality Date  . ABDOMINAL HYSTERECTOMY    . CHOLECYSTECTOMY    . KNEE SURGERY      Allergies  Allergen Reactions  . Sulfa Antibiotics Rash    Social History   Socioeconomic History  . Marital  status: Single    Spouse name: Not on file  . Number of children: Not on file  . Years of education: Not on file  . Highest education level: Not on file  Occupational History  . Not on file  Tobacco Use  . Smoking status: Never Smoker  . Smokeless tobacco: Never Used  Substance and Sexual Activity  . Alcohol use: Yes    Comment: occ  . Drug use: Never  . Sexual activity: Not on file  Other Topics Concern  . Not on file  Social History Narrative  . Not on file   Social Determinants of Health   Financial Resource Strain: Not on file  Food Insecurity: Not on file  Transportation Needs: Not on file  Physical Activity: Not on file  Stress: Not on file  Social Connections: Not on file  Intimate Partner Violence: Not on file    Family History  Problem Relation Age of Onset  . Diabetes Mother     Review of Systems Shortness of breath has improved No hemoptysis  Constitutional: negative for anorexia, fevers and sweats  Eyes: negative for irritation, redness and visual disturbance  Ears, nose, mouth, throat, and face: negative for earaches, epistaxis, nasal congestion and sore throat  Respiratory: negative for cough,  sputum and wheezing  Cardiovascular: negative for chest pain,  lower extremity edema, orthopnea, palpitations and syncope  Gastrointestinal: negative for abdominal pain, constipation, diarrhea, melena, nausea and vomiting  Genitourinary:negative for dysuria, frequency and hematuria  Hematologic/lymphatic:  negative for bleeding, easy bruising and lymphadenopathy  Musculoskeletal:negative for arthralgias, muscle weakness and stiff joints  Neurological: negative for coordination problems, gait problems, headaches and weakness  Endocrine: negative for diabetic symptoms including polydipsia, polyuria and weight loss     Objective:   Physical Exam   Gen. Pleasant, obese, in no distress, normal affect ENT - no pallor,icterus, no post nasal drip, class 2-3  airway Neck: No JVD, no thyromegaly, no carotid bruits Lungs: no use of accessory muscles, no dullness to percussion, decreased without rales or rhonchi  Cardiovascular: Rhythm regular, heart sounds  normal, no murmurs or gallops, no peripheral edema Abdomen: soft and non-tender, no hepatosplenomegaly, BS normal. Musculoskeletal: No deformities, no cyanosis or clubbing Neuro:  alert, non focal, no tremors        Assessment & Plan:

## 2020-07-09 NOTE — Assessment & Plan Note (Addendum)
She does not appear to be exceedingly sleepy.  Blood flags are noted snoring and occasional gasping choking episodes, no witnessed apneas Given excessive daytime somnolence, narrow pharyngeal exam, witnessed apneas & loud snoring, obstructive sleep apnea is very likely & an overnight polysomnogram will be scheduled as a home study. The pathophysiology of obstructive sleep apnea , it's cardiovascular consequences & modes of treatment including CPAP were discused with the patient in detail & they evidenced understanding.  Pretest probability is low to intermediate

## 2020-07-15 ENCOUNTER — Ambulatory Visit: Payer: Federal, State, Local not specified - PPO | Admitting: Pulmonary Disease

## 2020-07-23 DIAGNOSIS — R1900 Intra-abdominal and pelvic swelling, mass and lump, unspecified site: Secondary | ICD-10-CM | POA: Diagnosis not present

## 2020-07-23 DIAGNOSIS — N83202 Unspecified ovarian cyst, left side: Secondary | ICD-10-CM | POA: Diagnosis not present

## 2020-07-23 DIAGNOSIS — Z90710 Acquired absence of both cervix and uterus: Secondary | ICD-10-CM | POA: Diagnosis not present

## 2020-07-23 DIAGNOSIS — N9489 Other specified conditions associated with female genital organs and menstrual cycle: Secondary | ICD-10-CM | POA: Diagnosis not present

## 2020-08-06 DIAGNOSIS — N732 Unspecified parametritis and pelvic cellulitis: Secondary | ICD-10-CM | POA: Diagnosis not present

## 2020-08-06 DIAGNOSIS — R1900 Intra-abdominal and pelvic swelling, mass and lump, unspecified site: Secondary | ICD-10-CM | POA: Diagnosis not present

## 2020-08-06 DIAGNOSIS — N898 Other specified noninflammatory disorders of vagina: Secondary | ICD-10-CM | POA: Diagnosis not present

## 2020-08-08 ENCOUNTER — Ambulatory Visit: Payer: Federal, State, Local not specified - PPO

## 2020-08-08 ENCOUNTER — Ambulatory Visit (INDEPENDENT_AMBULATORY_CARE_PROVIDER_SITE_OTHER): Payer: Federal, State, Local not specified - PPO | Admitting: Pulmonary Disease

## 2020-08-08 ENCOUNTER — Other Ambulatory Visit: Payer: Self-pay

## 2020-08-08 ENCOUNTER — Encounter: Payer: Self-pay | Admitting: Pulmonary Disease

## 2020-08-08 VITALS — BP 138/74 | HR 94 | Temp 97.6°F | Ht 65.0 in | Wt 295.2 lb

## 2020-08-08 DIAGNOSIS — G4733 Obstructive sleep apnea (adult) (pediatric): Secondary | ICD-10-CM | POA: Diagnosis not present

## 2020-08-08 DIAGNOSIS — I2692 Saddle embolus of pulmonary artery without acute cor pulmonale: Secondary | ICD-10-CM

## 2020-08-08 NOTE — Progress Notes (Signed)
@Patient  ID: , female    DOB: 08-12-1970, 50 y.o.   MRN: [id number]  Chief Complaint  Patient presents with  . Establish Care    Establish care for pulmonary embolism. States she has been doing well since last visit. Denies any increased chest pains. No leg pain or swelling.     Referring provider: [id number], MD  HPI:   50 year old woman admitted 05/2020 with unprovoked massive PE s/p lytics on anticoagulation here for evaluation. Prior pulmonary note Dr. 06/2020 reviewed. Notes from hospitalization reviewed.  Patient scribed a 1 month history of dyspnea exertion starting and November.  She does have seasonal allergies and was concerned this was related to pollens in the fall.  She is she was steroids and inhaler without improvement.  Rather acutely on 05/30/2020 she had severe worsening shortness of breath.  Major change was pretty severe symptoms at rest as opposed with exertion.  Denied chest pain or other symptoms.  She went to the ED.  Admits that her [place] CTA revealed large clot burden in pulmonary arteries on my review and interpretation.  She was transferred to [facility].  She is noted be hypotensive.  TPA was given.  She had improvement in symptoms relatively shortly.  She is watched closely in the hospital.  Repeat CTA demonstrated improved clot burden on my review and interpretation.  She had 2 TTE that admission.  Both show slightly elevated right-sided pressures, diastolic dysfunction, LA was normal size.  She was discharged on oral anticoagulation.  Overall, her dyspnea has greatly improved.  She is back to work at home.  She would like to get back to low intensity exercising.  I encouraged this.  She does have intermittent chest pain which is different from presenting symptoms.  It is all over, left and right.  Not the same locations.  Not worse or triggered by exertion. Lasts a couple of minutes and resolves spontaneously.  No other alleviating  or exacerbating factors.  Recent seen [facility].  Recommend repeat TTE in the coming weeks.  Ordered sleep study for evaluation of OSA which could contribute elevated right-sided pressures.   PMH: Fibroids, hypertension Surgical history: Hysterectomy, cholecystectomy, knee surgery Family history: Mother with diabetes, denies significant respiratory symptoms and first relatives, no history of blood clotting disorders in first-degree relatives Social history: Never smoker, lives in [place], grew up in [place], at visit with daughter today  Questionaires / Pulmonary Flowsheets:   ACT:  No flowsheet data found.  MMRC: mMRC Dyspnea Scale mMRC Score  08/08/2020 0    Epworth:  Results of the Epworth flowsheet 07/09/2020  Sitting and reading 0  Watching TV 0  Sitting, inactive in a public place (e.g. a theatre or a meeting) 0  As a passenger in a car for an hour without a break 0  Lying down to rest in the afternoon when circumstances permit 1  Sitting and talking to someone 0  Sitting quietly after a lunch without alcohol 0  In a car, while stopped for a few minutes in traffic 0  Total score 1    Tests:   FENO:  No results found for: NITRICOXIDE  PFT: No flowsheet data found.  WALK:  No flowsheet data found.  Imaging: Personally reviewed and as per EMR and discussion in this note  Lab Results: Personally reviewed CBC    Component Value Date/Time   WBC 5.0 06/03/2020 0320   RBC 4.42  06/03/2020 0320   HGB 13.0 06/03/2020 0320   HCT 40.6 06/03/2020 0320   PLT 204 06/03/2020 0320   MCV 91.9 06/03/2020 0320   MCH 29.4 06/03/2020 0320   MCHC 32.0 06/03/2020 0320   RDW 14.8 06/03/2020 0320   LYMPHSABS 2.2 05/30/2020 1520   MONOABS 0.7 05/30/2020 1520   EOSABS 0.0 05/30/2020 1520   BASOSABS 0.0 05/30/2020 1520    BMET    Component Value Date/Time   NA 136 06/04/2020 0446   K 3.6 06/04/2020 0446   CL 102 06/04/2020 0446   CO2 26 06/04/2020 0446   GLUCOSE  135 (H) 06/04/2020 0446   BUN 15 06/04/2020 0446   CREATININE 0.91 06/04/2020 0446   CALCIUM 9.0 06/04/2020 0446   GFRNONAA >60 06/04/2020 0446   GFRAA >60 04/21/2015 1825    BNP    Component Value Date/Time   BNP 466.8 (H) 05/30/2020 1520    ProBNP No results found for: PROBNP  Specialty Problems      Pulmonary Problems   OSA (obstructive sleep apnea)      Allergies  Allergen Reactions  . Sulfa Antibiotics Rash    Immunization History  Administered Date(s) Administered  . Tdap 10/06/2011    Past Medical History:  Diagnosis Date  . Allergic rhinitis   . Cellulitis and abscess   . H/O blood clots   . Hypertension     Tobacco History: Social History   Tobacco Use  Smoking Status Never Smoker  Smokeless Tobacco Never Used   Counseling given: Not Answered   Continue to not smoke  Outpatient Encounter Medications as of 08/08/2020  Medication Sig  . hydrochlorothiazide (HYDRODIURIL) 25 MG tablet Take 1 tablet (25 mg total) by mouth daily.  . Magnesium 250 MG TABS Take 1 tablet by mouth daily.  . Multiple Vitamin (MULTIVITAMIN WITH MINERALS) TABS tablet Take 1 tablet by mouth daily.  Marland Kitchen RIVAROXABAN (XARELTO) VTE STARTER PACK (15 & 20 MG) Follow package directions: Take one 15mg  tablet by mouth twice a day. On day 22, 06/24/2020 switch to one 20mg  tablet once a day. Take with food.   No facility-administered encounter medications on file as of 08/08/2020.     Review of Systems  Review of Systems  No orthopnea or PND. No LE swelling.  Comprehensive review systems otherwise negative.  Physical Exam  BP 138/74   Pulse 94   Temp 97.6 F (36.4 C) (Temporal)   Ht 5\' 5"  (1.651 m)   Wt 295 lb 3.2 oz (133.9 kg)   SpO2 98% Comment: on RA  BMI 49.12 kg/m   Wt Readings from Last 5 Encounters:  08/08/20 295 lb 3.2 oz (133.9 kg)  07/09/20 299 lb (135.6 kg)  06/04/20 289 lb 14.4 oz (131.5 kg)  05/28/16 280 lb (127 kg)  12/15/15 280 lb (127 kg)    BMI  Readings from Last 5 Encounters:  08/08/20 49.12 kg/m  07/09/20 49.76 kg/m  06/04/20 48.24 kg/m  05/28/16 46.59 kg/m  12/15/15 46.59 kg/m     Physical Exam General: Well-appearing no distress Eyes: EOMI Neck: Supple no JVP appreciated CV: Warm, no lower extremity swelling Respiratory: Crackles bilaterally, normal Abdomen: Nondistended, bowel sounds present MSK: No synovitis, joint effusion appreciated Neuro: Normal gait, no weakness Psych: Normal mood, full affect   Assessment & Plan:   Massive PE: Unprovoked.  Genetic test for predisposition to blood clots negative.  Discussed likelihood of needing anticoagulation lifelong given the severity of pulmonary embolus.  She is to  continue apixaban at this time.  Possible pulmonary hypertension: RV dysfunction and elevated right-sided pressures in setting of massive PE as expected.  She is at risk for possible underlying pulmonary hypertension.  Likely group 3 disease.  Left side looks okay on TTE. --Agree with OSA evaluation --Repeat TTE 08/2020, if abnormal would pursue VQ scan to evaluate for possible chronic thromboembolic disease, further work-up/diagnostic tests for pulmonary hypertension.   Return in about 4 weeks (around 09/05/2020).   Karren Burly, MD 08/08/2020   This appointment required 60 minutes of patient care (this includes precharting, chart review, review of results, face-to-face care, etc.).

## 2020-08-08 NOTE — Patient Instructions (Signed)
Nice to meet you  We will look for the results of the heart ultrasound in March  Based on the results we may need to do more testing - we will see!  I am glad we are looking for sleep apnea

## 2020-08-12 DIAGNOSIS — R1900 Intra-abdominal and pelvic swelling, mass and lump, unspecified site: Secondary | ICD-10-CM | POA: Diagnosis not present

## 2020-08-14 ENCOUNTER — Telehealth: Payer: Self-pay | Admitting: Pulmonary Disease

## 2020-08-14 DIAGNOSIS — G4733 Obstructive sleep apnea (adult) (pediatric): Secondary | ICD-10-CM | POA: Diagnosis not present

## 2020-08-14 NOTE — Telephone Encounter (Signed)
HST showed very mild  OSA with AHI 7/ hr  Suggest that we hold off on treatment for now. Focus on weight loss and try to avoid sleeping in the supine position. Office visit with APP in 6 months to reassess

## 2020-08-15 NOTE — Telephone Encounter (Signed)
Called and went over HST results per Dr Vassie Loll with patient. All questions answered and patient expressed full understanding of results and of Dr. Reginia Naas recommendations to focus on weight loss and try to avoid sleeping in the supine position. Scheduled office visit with NP for Monday 02/03/2021 at the [facility] office per Dr Vassie Loll. Patient agreeable to time, date and location. Nothing further needed at this time.

## 2020-09-05 ENCOUNTER — Ambulatory Visit: Payer: Federal, State, Local not specified - PPO | Admitting: Pulmonary Disease

## 2020-09-10 DIAGNOSIS — I10 Essential (primary) hypertension: Secondary | ICD-10-CM | POA: Diagnosis not present

## 2020-09-10 DIAGNOSIS — R7303 Prediabetes: Secondary | ICD-10-CM | POA: Diagnosis not present

## 2020-09-10 DIAGNOSIS — N3001 Acute cystitis with hematuria: Secondary | ICD-10-CM | POA: Diagnosis not present

## 2020-09-10 DIAGNOSIS — I2692 Saddle embolus of pulmonary artery without acute cor pulmonale: Secondary | ICD-10-CM | POA: Diagnosis not present

## 2020-09-10 DIAGNOSIS — R300 Dysuria: Secondary | ICD-10-CM | POA: Diagnosis not present

## 2020-09-12 ENCOUNTER — Ambulatory Visit (HOSPITAL_COMMUNITY): Payer: Federal, State, Local not specified - PPO | Attending: Cardiology

## 2020-09-12 ENCOUNTER — Other Ambulatory Visit: Payer: Self-pay

## 2020-09-12 DIAGNOSIS — I2692 Saddle embolus of pulmonary artery without acute cor pulmonale: Secondary | ICD-10-CM | POA: Diagnosis not present

## 2020-09-12 LAB — ECHOCARDIOGRAM COMPLETE
Area-P 1/2: 5.38 cm2
S' Lateral: 3.2 cm

## 2020-09-12 NOTE — Progress Notes (Signed)
Called and went over Echo results per Dr Vassie Loll with patient. All questions answered and patient expressed full understanding. Nothing further needed at this time.

## 2020-09-19 ENCOUNTER — Ambulatory Visit (INDEPENDENT_AMBULATORY_CARE_PROVIDER_SITE_OTHER): Payer: Federal, State, Local not specified - PPO | Admitting: Pulmonary Disease

## 2020-09-19 ENCOUNTER — Encounter: Payer: Self-pay | Admitting: Pulmonary Disease

## 2020-09-19 ENCOUNTER — Other Ambulatory Visit: Payer: Self-pay

## 2020-09-19 VITALS — BP 136/72 | HR 90 | Temp 97.2°F | Ht 65.0 in | Wt 297.8 lb

## 2020-09-19 DIAGNOSIS — I2692 Saddle embolus of pulmonary artery without acute cor pulmonale: Secondary | ICD-10-CM

## 2020-09-19 NOTE — Progress Notes (Signed)
@Patient  ID: , female    DOB: [birth year], 50 y.o.   MRN: [id number]  Chief Complaint  Patient presents with  . Follow-up    F/U after echo. States she has been feeling well since last visit.     Referring provider: [id number], MD  HPI:   50 year old woman admitted 05/2020 with unprovoked massive PE s/p lytics on anticoagulation with initial RV dysfunction here for follow-up.  Most recent PCP note reviewed.  Recent gynecology note planning for endometrial biopsy reviewed.  Overall she feels well.  Fatigue better.  No chest pain.  No shortness of breath.  Denies any respiratory symptoms.  Plan to undergo screening colonoscopy in the coming days, expecting GI doctor reach out to me in terms of plan of anticoagulation surrounding that event.  Discussed with her again that this is an unprovoked clot.  Tentative planning for indefinite and anticoagulation.  Discussed that gynecology is planning on biopsying an area in her pelvis that is been abnormal on imaging.  Discussed the plan of holding anticoagulation surrounding these events with patient.  Happy to discuss with her providers as well.   HPI initial visit: Patient scribed a 1 month history of dyspnea exertion starting and November.  She does have seasonal allergies and was concerned this was related to pollens in the fall.  She is she was steroids and inhaler without improvement.  Rather acutely on 05/30/2020 she had severe worsening shortness of breath.  Major change was pretty severe symptoms at rest as opposed with exertion.  Denied chest pain or other symptoms.  She went to the ED.  Admits that her [place] CTA revealed large clot burden in pulmonary arteries on my review and interpretation.  She was transferred to [facility].  She is noted be hypotensive.  TPA was given.  She had improvement in symptoms relatively shortly.  She is watched closely in the hospital.  Repeat CTA demonstrated improved clot burden  on my review and interpretation.  She had 2 TTE that admission.  Both show slightly elevated right-sided pressures, diastolic dysfunction, LA was normal size.  She was discharged on oral anticoagulation.  Overall, her dyspnea has greatly improved.  She is back to work at home.  She would like to get back to low intensity exercising.  I encouraged this.  She does have intermittent chest pain which is different from presenting symptoms.  It is all over, left and right.  Not the same locations.  Not worse or triggered by exertion. Lasts a couple of minutes and resolves spontaneously.  No other alleviating or exacerbating factors.  Recent seen [facility].  Recommend repeat TTE in the coming weeks.  Ordered sleep study for evaluation of OSA which could contribute elevated right-sided pressures.   PMH: Fibroids, hypertension Surgical history: Hysterectomy, cholecystectomy, knee surgery Family history: Mother with diabetes, denies significant respiratory symptoms and first relatives, no history of blood clotting disorders in first-degree relatives Social history: Never smoker, lives in [place], grew up in [place], at visit with daughter today  Questionaires / Pulmonary Flowsheets:   ACT:  No flowsheet data found.  MMRC: mMRC Dyspnea Scale mMRC Score  08/08/2020 0    Epworth:  Results of the Epworth flowsheet 07/09/2020  Sitting and reading 0  Watching TV 0  Sitting, inactive in a public place (e.g. a theatre or a meeting) 0  As a passenger in a car for an hour without a break 0  Lying down to rest in the afternoon when circumstances permit 1  Sitting and talking to someone 0  Sitting quietly after a lunch without alcohol 0  In a car, while stopped for a few minutes in traffic 0  Total score 1    Tests:   FENO:  No results found for: NITRICOXIDE  PFT: No flowsheet data found.  WALK:  No flowsheet data found.  Imaging: Personally reviewed and as per EMR and discussion in this  note  Lab Results: Personally reviewed CBC    Component Value Date/Time   WBC 5.0 06/03/2020 0320   RBC 4.42 06/03/2020 0320   HGB 13.0 06/03/2020 0320   HCT 40.6 06/03/2020 0320   PLT 204 06/03/2020 0320   MCV 91.9 06/03/2020 0320   MCH 29.4 06/03/2020 0320   MCHC 32.0 06/03/2020 0320   RDW 14.8 06/03/2020 0320   LYMPHSABS 2.2 05/30/2020 1520   MONOABS 0.7 05/30/2020 1520   EOSABS 0.0 05/30/2020 1520   BASOSABS 0.0 05/30/2020 1520    BMET    Component Value Date/Time   NA 136 06/04/2020 0446   K 3.6 06/04/2020 0446   CL 102 06/04/2020 0446   CO2 26 06/04/2020 0446   GLUCOSE 135 (H) 06/04/2020 0446   BUN 15 06/04/2020 0446   CREATININE 0.91 06/04/2020 0446   CALCIUM 9.0 06/04/2020 0446   GFRNONAA >60 06/04/2020 0446   GFRAA >60 04/21/2015 1825    BNP    Component Value Date/Time   BNP 466.8 (H) 05/30/2020 1520    ProBNP No results found for: PROBNP  Specialty Problems      Pulmonary Problems   OSA (obstructive sleep apnea)      Allergies  Allergen Reactions  . Sulfa Antibiotics Rash    Immunization History  Administered Date(s) Administered  . Tdap 10/06/2011    Past Medical History:  Diagnosis Date  . Allergic rhinitis   . Cellulitis and abscess   . H/O blood clots   . Hypertension     Tobacco History: Social History   Tobacco Use  Smoking Status Never Smoker  Smokeless Tobacco Never Used   Counseling given: Not Answered   Continue to not smoke  Outpatient Encounter Medications as of 09/19/2020  Medication Sig  . hydrochlorothiazide (HYDRODIURIL) 25 MG tablet Take 1 tablet (25 mg total) by mouth daily.  . Magnesium 250 MG TABS Take 1 tablet by mouth daily.  . Multiple Vitamin (MULTIVITAMIN WITH MINERALS) TABS tablet Take 1 tablet by mouth daily.  Marland Kitchen RIVAROXABAN (XARELTO) VTE STARTER PACK (15 & 20 MG) Follow package directions: Take one 15mg  tablet by mouth twice a day. On day 22, 06/24/2020 switch to one 20mg  tablet once a day.  Take with food.   No facility-administered encounter medications on file as of 09/19/2020.     Review of Systems  Review of Systems  N/A  Physical Exam  BP 136/72   Pulse 90   Temp (!) 97.2 F (36.2 C) (Temporal)   Ht 5\' 5"  (1.651 m)   Wt 297 lb 12.8 oz (135.1 kg)   SpO2 100% Comment: on RA  BMI 49.56 kg/m   Wt Readings from Last 5 Encounters:  09/19/20 297 lb 12.8 oz (135.1 kg)  08/08/20 295 lb 3.2 oz (133.9 kg)  07/09/20 299 lb (135.6 kg)  06/04/20 289 lb 14.4 oz (131.5 kg)  05/28/16 280 lb (127 kg)    BMI Readings from Last 5 Encounters:  09/19/20 49.56 kg/m  08/08/20 49.12 kg/m  07/09/20 49.76 kg/m  06/04/20 48.24 kg/m  05/28/16 46.59 kg/m     Physical Exam General: Well-appearing no distress Neck: Supple, no JVP appreciated CV: Warm, no lower extremity swelling Respiratory: Clear to auscultation bilaterally, normal work of breathing Abdomen: Nondistended, bowel sounds present MSK: No synovitis, joint effusion appreciated Neuro: Normal gait, no weakness Psych: Normal mood, full affect   Assessment & Plan:   Massive PE: Unprovoked.  Genetic test for predisposition to blood clots negative.  Ongoing gynecologic evaluation for pelvic abnormality on imaging, possible trigger although per review of note they think malignancy is unlikely.  Discussed likelihood of needing anticoagulation lifelong given the severity of pulmonary embolus.  She is to continue anticoagulation at this time.  Will refer to hematology for any additional work-up that may need to be needed given unprovoked massive PE.  Also, would appreciate their opinion on duration of anticoagulation.  Acute RV dysfunction in setting of massive PE: RV dysfunction and elevated right-sided pressures in setting of massive PE as expected.  Repeat TTE 3 months on anticoagulation revealed normal RV function, normal right-sided pressures which is very reassuring with treatment of PE.     Return in about 3  months (around 12/20/2020).   Karren Burly, MD 09/19/2020   This appointment required 33 minutes of patient care (this includes precharting, chart review, review of results, face-to-face care, etc.).

## 2020-09-19 NOTE — Patient Instructions (Signed)
Nice to meet you  For now, let us continue the Xarelto for the pulmonary embolus given how severe the clot was.  I am very encouraged that the heart ultrasound showed improvement on the stress of the heart.  To help [place] make decisions in terms of how long he should be on the blood thinner, I have referred you to the hematologist or blood doctors.  I hope their opinion will be helpful.  They may want to do additional testing to try to figure out why you have a blood clot since we do not have a good idea of why.  For now, after discussing, we will plan to keep you on blood thinners for the foreseeable future.  I am happy to discuss with your gynecologist and gastroenterologist plans for anticoagulation around these needed biopsies and procedures.  I would recommend use of stop the Xarelto for a certain number of days prior to the procedure at their discretion then resume the daily dose the following day.  Return to clinic 3 months with Dr. Judeth Horn for follow-up

## 2020-10-10 DIAGNOSIS — S61211A Laceration without foreign body of left index finger without damage to nail, initial encounter: Secondary | ICD-10-CM | POA: Diagnosis not present

## 2020-10-10 DIAGNOSIS — Z23 Encounter for immunization: Secondary | ICD-10-CM | POA: Diagnosis not present

## 2020-10-18 ENCOUNTER — Other Ambulatory Visit: Payer: Self-pay | Admitting: Family

## 2020-10-18 DIAGNOSIS — I10 Essential (primary) hypertension: Secondary | ICD-10-CM | POA: Diagnosis not present

## 2020-10-18 DIAGNOSIS — Z1211 Encounter for screening for malignant neoplasm of colon: Secondary | ICD-10-CM | POA: Diagnosis not present

## 2020-10-18 DIAGNOSIS — I2692 Saddle embolus of pulmonary artery without acute cor pulmonale: Secondary | ICD-10-CM

## 2020-10-21 ENCOUNTER — Encounter: Payer: Self-pay | Admitting: Family

## 2020-10-21 ENCOUNTER — Inpatient Hospital Stay: Payer: Federal, State, Local not specified - PPO | Attending: Family

## 2020-10-21 ENCOUNTER — Other Ambulatory Visit: Payer: Self-pay

## 2020-10-21 ENCOUNTER — Inpatient Hospital Stay (HOSPITAL_BASED_OUTPATIENT_CLINIC_OR_DEPARTMENT_OTHER): Payer: Federal, State, Local not specified - PPO | Admitting: Family

## 2020-10-21 VITALS — BP 143/78 | HR 57 | Temp 98.2°F | Resp 18 | Wt 299.0 lb

## 2020-10-21 DIAGNOSIS — I82403 Acute embolism and thrombosis of unspecified deep veins of lower extremity, bilateral: Secondary | ICD-10-CM | POA: Diagnosis not present

## 2020-10-21 DIAGNOSIS — D509 Iron deficiency anemia, unspecified: Secondary | ICD-10-CM | POA: Diagnosis not present

## 2020-10-21 DIAGNOSIS — Z79899 Other long term (current) drug therapy: Secondary | ICD-10-CM | POA: Insufficient documentation

## 2020-10-21 DIAGNOSIS — Z86711 Personal history of pulmonary embolism: Secondary | ICD-10-CM | POA: Diagnosis not present

## 2020-10-21 DIAGNOSIS — Z86718 Personal history of other venous thrombosis and embolism: Secondary | ICD-10-CM | POA: Insufficient documentation

## 2020-10-21 DIAGNOSIS — Z833 Family history of diabetes mellitus: Secondary | ICD-10-CM | POA: Insufficient documentation

## 2020-10-21 DIAGNOSIS — I2692 Saddle embolus of pulmonary artery without acute cor pulmonale: Secondary | ICD-10-CM | POA: Diagnosis not present

## 2020-10-21 DIAGNOSIS — Z882 Allergy status to sulfonamides status: Secondary | ICD-10-CM | POA: Diagnosis not present

## 2020-10-21 DIAGNOSIS — Z7901 Long term (current) use of anticoagulants: Secondary | ICD-10-CM | POA: Insufficient documentation

## 2020-10-21 DIAGNOSIS — I824Z2 Acute embolism and thrombosis of unspecified deep veins of left distal lower extremity: Secondary | ICD-10-CM

## 2020-10-21 DIAGNOSIS — Z9049 Acquired absence of other specified parts of digestive tract: Secondary | ICD-10-CM | POA: Diagnosis not present

## 2020-10-21 LAB — CBC WITH DIFFERENTIAL (CANCER CENTER ONLY)
Abs Immature Granulocytes: 0.01 10*3/uL (ref 0.00–0.07)
Basophils Absolute: 0 10*3/uL (ref 0.0–0.1)
Basophils Relative: 1 %
Eosinophils Absolute: 0.1 10*3/uL (ref 0.0–0.5)
Eosinophils Relative: 2 %
HCT: 36.6 % (ref 36.0–46.0)
Hemoglobin: 11.8 g/dL — ABNORMAL LOW (ref 12.0–15.0)
Immature Granulocytes: 0 %
Lymphocytes Relative: 44 %
Lymphs Abs: 2.1 10*3/uL (ref 0.7–4.0)
MCH: 29.1 pg (ref 26.0–34.0)
MCHC: 32.2 g/dL (ref 30.0–36.0)
MCV: 90.1 fL (ref 80.0–100.0)
Monocytes Absolute: 0.4 10*3/uL (ref 0.1–1.0)
Monocytes Relative: 9 %
Neutro Abs: 2.2 10*3/uL (ref 1.7–7.7)
Neutrophils Relative %: 44 %
Platelet Count: 261 10*3/uL (ref 150–400)
RBC: 4.06 MIL/uL (ref 3.87–5.11)
RDW: 13.7 % (ref 11.5–15.5)
WBC Count: 4.8 10*3/uL (ref 4.0–10.5)
nRBC: 0 % (ref 0.0–0.2)

## 2020-10-21 LAB — CMP (CANCER CENTER ONLY)
ALT: 19 U/L (ref 0–44)
AST: 14 U/L — ABNORMAL LOW (ref 15–41)
Albumin: 3.9 g/dL (ref 3.5–5.0)
Alkaline Phosphatase: 66 U/L (ref 38–126)
Anion gap: 7 (ref 5–15)
BUN: 9 mg/dL (ref 6–20)
CO2: 28 mmol/L (ref 22–32)
Calcium: 9.7 mg/dL (ref 8.9–10.3)
Chloride: 107 mmol/L (ref 98–111)
Creatinine: 0.92 mg/dL (ref 0.44–1.00)
GFR, Estimated: >60 mL/min (ref >60)
Glucose, Bld: 100 mg/dL — ABNORMAL HIGH (ref 70–99)
Potassium: 3.8 mmol/L (ref 3.5–5.1)
Sodium: 142 mmol/L (ref 135–145)
Total Bilirubin: 0.5 mg/dL (ref 0.3–1.2)
Total Protein: 7.4 g/dL (ref 6.5–8.1)

## 2020-10-21 LAB — LACTATE DEHYDROGENASE: LDH: 180 U/L (ref 98–192)

## 2020-10-21 LAB — IRON AND TIBC
Iron: 65 ug/dL (ref 41–142)
Saturation Ratios: 24 % (ref 21–57)
TIBC: 269 ug/dL (ref 236–444)
UIBC: 204 ug/dL (ref 120–384)

## 2020-10-21 LAB — FERRITIN: Ferritin: 95 ng/mL (ref 11–307)

## 2020-10-21 MED ORDER — RIVAROXABAN 20 MG PO TABS: 20.0000 mg | ORAL_TABLET | Freq: Every day | ORAL | 3 refills | Status: DC

## 2020-10-21 NOTE — Progress Notes (Addendum)
Hematology/Oncology Consultation   Name: [name]      MRN: [id number]    Location: Room/bed info not found  Date: 10/21/2020 Time:9:44 AM   REFERRING PHYSICIAN: Vilma Meckel, MD  REASON FOR CONSULT: Acute bilateral saddle PE with right heart strain and left lower extremity DVT   DIAGNOSIS: Acute bilateral saddle PE with right heart strain and left lower extremity DVT  HISTORY OF PRESENT ILLNESS: [name] is a very pleasant 50 yo African American female with diagnosis of bilateral pulmonary emboli with right heart strain and left lower extremity DVT involving the popliteal and posterior tibial veins in December 2021.  Hyper coag panel at that time was negative.  She took Xarelto from December and stopped in early April. She had no issues with blood loss, bruising or petechiae while on anticoagulation.  She has not prior history of thrombus.  No family history of thrombotic event.  She has not had follow-up scans since December.  ECHO in March showed an EF of 56% and she continues to follow-up with cardiology.  She has had surgeries in the past without complications.  She had a myomectomy in 2006 and eventually had hysterectomy in 2014 due to the fibroids and heavy cycles.   No history of miscarriage.  No personal or familial history of cancer.  No fever, chills, n/v, cough, rash, dizziness, SOB, chest pain, palpitations, abdominal pain or changes in bowel or bladder habits.  She has puffiness in her ankles with long episodes of standing and resolves once she props up her legs.   No tenderness, numbness or tingling in her extremities.  She is prediabetic and has started a keto diet including Blendtopia smoothies. She is also walking for exercise.  She feels that she is staying well hydrated.  No history of thyroid disease.  She denies travel, injury, infection or dehydration at or around time of diagnosis.  She denies hormone replacement therapy.  No smoking, ETOH or  recreational drug use.  She works with [employer].   ROS: All other 10 point review of systems is negative.   PAST MEDICAL HISTORY:   Past Medical History:  Diagnosis Date  . Allergic rhinitis   . Cellulitis and abscess   . H/O blood clots   . Hypertension     ALLERGIES: Allergies  Allergen Reactions  . Sulfa Antibiotics Rash      MEDICATIONS:  Current Outpatient Medications on File Prior to Visit  Medication Sig Dispense Refill  . albuterol (VENTOLIN HFA) 108 (90 Base) MCG/ACT inhaler Inhale into the lungs.    . hydrochlorothiazide (HYDRODIURIL) 25 MG tablet TAKE 1 TABLET (25 MG TOTAL) BY MOUTH DAILY. (Patient not taking: Reported on 10/21/2020) 30 tablet 0  . Magnesium 250 MG TABS Take 1 tablet by mouth daily.    . Multiple Vitamin (MULTIVITAMIN WITH MINERALS) TABS tablet Take 1 tablet by mouth daily.    Marland Kitchen RIVAROXABAN (XARELTO) VTE STARTER PACK (15 & 20 MG) TAKE 1 TABLET BY MOUTH TWICE DAILY ON DAY 22 06/24/20 SWITCH TO ONE 20MG  TABLET ONCE A DAY TAKE WITH FOOD (Patient not taking: Reported on 10/21/2020) 51 each 0   No current facility-administered medications on file prior to visit.     PAST SURGICAL HISTORY Past Surgical History:  Procedure Laterality Date  . ABDOMINAL HYSTERECTOMY    . CHOLECYSTECTOMY    . KNEE SURGERY      FAMILY HISTORY: Family History  Problem Relation Age of Onset  . Diabetes Mother  SOCIAL HISTORY:  reports that she has never smoked. She has never used smokeless tobacco. She reports current alcohol use. She reports that she does not use drugs.  PERFORMANCE STATUS: The patient's performance status is 1 - Symptomatic but completely ambulatory  PHYSICAL EXAM: Most Recent Vital Signs: Blood pressure (!) 143/78, pulse (!) 57, temperature 98.2 F (36.8 C), temperature source Oral, resp. rate 18, weight 299 lb (135.6 kg), SpO2 96 %. BP (!) 143/78 (BP Location: Left Arm, Patient Position: Sitting)   Pulse (!) 57   Temp 98.2 F (36.8 C)  (Oral)   Resp 18   Wt 299 lb (135.6 kg)   SpO2 96%   BMI 49.76 kg/m   General Appearance:    Alert, cooperative, no distress, appears stated age  Head:    Normocephalic, without obvious abnormality, atraumatic  Eyes:    PERRL, conjunctiva/corneas clear, EOM's intact, fundi    benign, both eyes        Throat:   Lips, mucosa, and tongue normal; teeth and gums normal  Neck:   Supple, symmetrical, trachea midline, no adenopathy;    thyroid:  no enlargement/tenderness/nodules; no carotid   bruit or JVD  Back:     Symmetric, no curvature, ROM normal, no CVA tenderness  Lungs:     Clear to auscultation bilaterally, respirations unlabored  Chest Wall:    No tenderness or deformity   Heart:    Regular rate and rhythm, S1 and S2 normal, no murmur, rub   or gallop     Abdomen:     Soft, non-tender, bowel sounds active all four quadrants,    no masses, no organomegaly        Extremities:   Extremities normal, atraumatic, no cyanosis or edema  Pulses:   2+ and symmetric all extremities  Skin:   Skin color, texture, turgor normal, no rashes or lesions  Lymph nodes:   Cervical, supraclavicular, and axillary nodes normal  Neurologic:   CNII-XII intact, normal strength, sensation and reflexes    throughout    LABORATORY DATA:  Results for orders placed or performed in visit on 10/21/20 (from the past 48 hour(s))  CBC with Differential (Cancer Center Only)     Status: Abnormal   Collection Time: 10/21/20  9:11 AM  Result Value Ref Range   WBC Count 4.8 4.0 - 10.5 K/uL   RBC 4.06 3.87 - 5.11 MIL/uL   Hemoglobin 11.8 (L) 12.0 - 15.0 g/dL   HCT 25.8 52.7 - 78.2 %   MCV 90.1 80.0 - 100.0 fL   MCH 29.1 26.0 - 34.0 pg   MCHC 32.2 30.0 - 36.0 g/dL   RDW 42.3 53.6 - 14.4 %   Platelet Count 261 150 - 400 K/uL   nRBC 0.0 0.0 - 0.2 %   Neutrophils Relative % 44 %   Neutro Abs 2.2 1.7 - 7.7 K/uL   Lymphocytes Relative 44 %   Lymphs Abs 2.1 0.7 - 4.0 K/uL   Monocytes Relative 9 %   Monocytes  Absolute 0.4 0.1 - 1.0 K/uL   Eosinophils Relative 2 %   Eosinophils Absolute 0.1 0.0 - 0.5 K/uL   Basophils Relative 1 %   Basophils Absolute 0.0 0.0 - 0.1 K/uL   Immature Granulocytes 0 %   Abs Immature Granulocytes 0.01 0.00 - 0.07 K/uL    Comment: Performed at [facility], [address] [place], [place] [address]  RADIOGRAPHY: No results found.     PATHOLOGY: None  ASSESSMENT/PLAN: Ms. Winthrop is a very pleasant 50 yo African American female with diagnosis of bilateral pulmonary emboli with right heart strain and left lower extremity DVT involving the popliteal and posterior tibial veins in December 2021. Hyper coag panel was negative.  She completed only 4 months of anticoagulation.  I spoke with Dr. Myna Hidalgo and we will have her restart anticoagulation with Xarelto to complete one year of full dose followed by 1 year of maintenance dosing.  We will get repeat CT angio and left lower extremity US within the next week to assess response to therapy.  Follow-up in 3 months.   All questions were answered. The patient knows to call the clinic with any problems, questions or concerns. We can certainly see the patient much sooner if necessary.  The patient was discussed with Dr. Myna Hidalgo and he is in agreement with the aforementioned.   Emeline Gins, NP

## 2020-10-22 ENCOUNTER — Telehealth: Payer: Self-pay | Admitting: *Deleted

## 2020-10-22 NOTE — Telephone Encounter (Signed)
Per los 10/21/20 called and gave patient upcoming appointments

## 2020-10-23 ENCOUNTER — Ambulatory Visit (HOSPITAL_BASED_OUTPATIENT_CLINIC_OR_DEPARTMENT_OTHER): Admission: RE | Admit: 2020-10-23 | Payer: Federal, State, Local not specified - PPO | Source: Ambulatory Visit

## 2020-10-24 ENCOUNTER — Ambulatory Visit (HOSPITAL_BASED_OUTPATIENT_CLINIC_OR_DEPARTMENT_OTHER)
Admission: RE | Admit: 2020-10-24 | Discharge: 2020-10-24 | Disposition: A | Discharge status: Home / Self Care | Payer: Federal, State, Local not specified - PPO | Source: Ambulatory Visit | Attending: Family | Admitting: Family

## 2020-10-24 ENCOUNTER — Other Ambulatory Visit: Payer: Self-pay

## 2020-10-24 DIAGNOSIS — J9811 Atelectasis: Secondary | ICD-10-CM | POA: Diagnosis not present

## 2020-10-24 DIAGNOSIS — I2692 Saddle embolus of pulmonary artery without acute cor pulmonale: Secondary | ICD-10-CM | POA: Insufficient documentation

## 2020-10-24 DIAGNOSIS — I824Z2 Acute embolism and thrombosis of unspecified deep veins of left distal lower extremity: Secondary | ICD-10-CM | POA: Insufficient documentation

## 2020-10-24 DIAGNOSIS — I2699 Other pulmonary embolism without acute cor pulmonale: Secondary | ICD-10-CM | POA: Diagnosis not present

## 2020-10-24 DIAGNOSIS — Z86718 Personal history of other venous thrombosis and embolism: Secondary | ICD-10-CM | POA: Diagnosis not present

## 2020-10-24 DIAGNOSIS — I2694 Multiple subsegmental pulmonary emboli without acute cor pulmonale: Secondary | ICD-10-CM | POA: Diagnosis not present

## 2020-10-24 MED ORDER — IOHEXOL 350 MG/ML SOLN
100.0000 mL | Freq: Once | INTRAVENOUS | Status: AC | PRN
  Administered 2020-10-24: 100 mL via INTRAVENOUS

## 2020-10-28 ENCOUNTER — Encounter: Payer: Self-pay | Admitting: *Deleted

## 2020-12-19 DIAGNOSIS — Z7189 Other specified counseling: Secondary | ICD-10-CM | POA: Diagnosis not present

## 2021-01-17 ENCOUNTER — Other Ambulatory Visit: Payer: Self-pay | Admitting: Family

## 2021-01-17 DIAGNOSIS — I824Z2 Acute embolism and thrombosis of unspecified deep veins of left distal lower extremity: Secondary | ICD-10-CM

## 2021-01-17 DIAGNOSIS — Z209 Contact with and (suspected) exposure to unspecified communicable disease: Secondary | ICD-10-CM | POA: Diagnosis not present

## 2021-01-20 ENCOUNTER — Other Ambulatory Visit: Payer: Self-pay

## 2021-01-20 ENCOUNTER — Inpatient Hospital Stay (HOSPITAL_BASED_OUTPATIENT_CLINIC_OR_DEPARTMENT_OTHER): Payer: Federal, State, Local not specified - PPO | Admitting: Family

## 2021-01-20 ENCOUNTER — Inpatient Hospital Stay: Payer: Federal, State, Local not specified - PPO | Attending: Family

## 2021-01-20 ENCOUNTER — Encounter: Payer: Self-pay | Admitting: Family

## 2021-01-20 VITALS — BP 135/86 | HR 81 | Temp 98.6°F | Resp 18 | Ht 65.0 in | Wt 299.0 lb

## 2021-01-20 DIAGNOSIS — Z79899 Other long term (current) drug therapy: Secondary | ICD-10-CM | POA: Insufficient documentation

## 2021-01-20 DIAGNOSIS — I82402 Acute embolism and thrombosis of unspecified deep veins of left lower extremity: Secondary | ICD-10-CM | POA: Insufficient documentation

## 2021-01-20 DIAGNOSIS — I824Z2 Acute embolism and thrombosis of unspecified deep veins of left distal lower extremity: Secondary | ICD-10-CM | POA: Diagnosis not present

## 2021-01-20 DIAGNOSIS — I2692 Saddle embolus of pulmonary artery without acute cor pulmonale: Secondary | ICD-10-CM | POA: Diagnosis not present

## 2021-01-20 DIAGNOSIS — Z882 Allergy status to sulfonamides status: Secondary | ICD-10-CM | POA: Diagnosis not present

## 2021-01-20 DIAGNOSIS — Z7901 Long term (current) use of anticoagulants: Secondary | ICD-10-CM | POA: Diagnosis not present

## 2021-01-20 LAB — CMP (CANCER CENTER ONLY)
ALT: 17 U/L (ref 0–44)
AST: 14 U/L — ABNORMAL LOW (ref 15–41)
Albumin: 4.1 g/dL (ref 3.5–5.0)
Alkaline Phosphatase: 88 U/L (ref 38–126)
Anion gap: 8 (ref 5–15)
BUN: 15 mg/dL (ref 6–20)
CO2: 27 mmol/L (ref 22–32)
Calcium: 9.8 mg/dL (ref 8.9–10.3)
Chloride: 105 mmol/L (ref 98–111)
Creatinine: 1.06 mg/dL — ABNORMAL HIGH (ref 0.44–1.00)
GFR, Estimated: >60 mL/min (ref >60)
Glucose, Bld: 125 mg/dL — ABNORMAL HIGH (ref 70–99)
Potassium: 3.8 mmol/L (ref 3.5–5.1)
Sodium: 140 mmol/L (ref 135–145)
Total Bilirubin: 0.7 mg/dL (ref 0.3–1.2)
Total Protein: 7.7 g/dL (ref 6.5–8.1)

## 2021-01-20 LAB — CBC WITH DIFFERENTIAL (CANCER CENTER ONLY)
Abs Immature Granulocytes: 0.02 10*3/uL (ref 0.00–0.07)
Basophils Absolute: 0 10*3/uL (ref 0.0–0.1)
Basophils Relative: 1 %
Eosinophils Absolute: 0.1 10*3/uL (ref 0.0–0.5)
Eosinophils Relative: 2 %
HCT: 38.9 % (ref 36.0–46.0)
Hemoglobin: 12.7 g/dL (ref 12.0–15.0)
Immature Granulocytes: 0 %
Lymphocytes Relative: 33 %
Lymphs Abs: 2.1 10*3/uL (ref 0.7–4.0)
MCH: 28.7 pg (ref 26.0–34.0)
MCHC: 32.6 g/dL (ref 30.0–36.0)
MCV: 88 fL (ref 80.0–100.0)
Monocytes Absolute: 0.6 10*3/uL (ref 0.1–1.0)
Monocytes Relative: 9 %
Neutro Abs: 3.6 10*3/uL (ref 1.7–7.7)
Neutrophils Relative %: 55 %
Platelet Count: 238 10*3/uL (ref 150–400)
RBC: 4.42 MIL/uL (ref 3.87–5.11)
RDW: 14.3 % (ref 11.5–15.5)
WBC Count: 6.5 10*3/uL (ref 4.0–10.5)
nRBC: 0 % (ref 0.0–0.2)

## 2021-01-20 LAB — D-DIMER, QUANTITATIVE: D-Dimer, Quant: 0.42 ug/mL-FEU (ref 0.00–0.50)

## 2021-01-20 NOTE — Progress Notes (Signed)
Hematology and Oncology Follow Up Visit  CASSEY BACIGALUPO [id number] 01-16-1971 50 y.o. 01/20/2021   Principle Diagnosis:  Acute bilateral saddle PE with right heart strain and left lower extremity DVT  Current Therapy:   Xarelto 20 mg PO daily   Interim History:  Ms. Funk is here today for follow-up. She is doing well and has no complaints at this time. She is preparing to go visit friends in [place]! She is so excited. We advised she wear compression stockings during her flight, to get up and move around when she can and to stay well hydrated.  She has had no issue with bleeding. No bruising or petechiae.  She is taking her Xarelto daily as prescribed.  No fever, chills, n/v, cough, rash, dizziness, SOB, chest pain, palpitations, abdominal pain or changes in bowel or bladder habits.  No swelling, tenderness, numbness or tingling in her extremities.  No falls or syncope.  She has maintained a good appetite and is staying well hydrated. Her weight is stable at 299 lbs.   ECOG Performance Status: 1 - Symptomatic but completely ambulatory  Medications:  Allergies as of 01/20/2021       Reactions   Sulfa Antibiotics Rash        Medication List        Accurate as of January 20, 2021 10:06 AM. If you have any questions, ask your nurse or doctor.          albuterol 108 (90 Base) MCG/ACT inhaler Commonly known as: VENTOLIN HFA Inhale into the lungs.   hydrochlorothiazide 25 MG tablet Commonly known as: HYDRODIURIL TAKE 1 TABLET (25 MG TOTAL) BY MOUTH DAILY.   Magnesium 250 MG Tabs Take 1 tablet by mouth daily.   multivitamin with minerals Tabs tablet Take 1 tablet by mouth daily.   rivaroxaban 20 MG Tabs tablet Commonly known as: XARELTO Take 1 tablet (20 mg total) by mouth daily with supper.        Allergies:  Allergies  Allergen Reactions   Sulfa Antibiotics Rash    Past Medical History, Surgical history, Social history, and Family History were reviewed  and updated.  Review of Systems: All other 10 point review of systems is negative.   Physical Exam:  vitals were not taken for this visit.   Wt Readings from Last 3 Encounters:  10/21/20 299 lb (135.6 kg)  09/19/20 297 lb 12.8 oz (135.1 kg)  08/08/20 295 lb 3.2 oz (133.9 kg)    Ocular: Sclerae unicteric, pupils equal, round and reactive to light Ear-nose-throat: Oropharynx clear, dentition fair Lymphatic: No cervical or supraclavicular adenopathy Lungs no rales or rhonchi, good excursion bilaterally Heart regular rate and rhythm, no murmur appreciated Abd soft, nontender, positive bowel sounds MSK no focal spinal tenderness, no joint edema Neuro: non-focal, well-oriented, appropriate affect Breasts: Deferred   Lab Results  Component Value Date   WBC 6.5 01/20/2021   HGB 12.7 01/20/2021   HCT 38.9 01/20/2021   MCV 88.0 01/20/2021   PLT 238 01/20/2021   Lab Results  Component Value Date   FERRITIN 95 10/21/2020   IRON 65 10/21/2020   TIBC 269 10/21/2020   UIBC 204 10/21/2020   IRONPCTSAT 24 10/21/2020   Lab Results  Component Value Date   RBC 4.42 01/20/2021   No results found for: KPAFRELGTCHN, LAMBDASER, KAPLAMBRATIO No results found for: IGGSERUM, IGA, IGMSERUM No results found for: TOTALPROTELP, ALBUMINELP, A1GS, A2GS, BETS, BETA2SER, GAMS, MSPIKE, SPEI   Chemistry  Component Value Date/Time   NA 140 01/20/2021 0924   K 3.8 01/20/2021 0924   CL 105 01/20/2021 0924   CO2 27 01/20/2021 0924   BUN 15 01/20/2021 0924   CREATININE 1.06 (H) 01/20/2021 0924      Component Value Date/Time   CALCIUM 9.8 01/20/2021 0924   ALKPHOS 88 01/20/2021 0924   AST 14 (L) 01/20/2021 0924   ALT 17 01/20/2021 0924   BILITOT 0.7 01/20/2021 0924       Impression and Plan: Ms. Wiley is a very pleasant 50 yo African American female with diagnosis of bilateral pulmonary emboli with right heart strain and left lower extremity DVT involving the popliteal and posterior  tibial veins in December 2021. Hyper coag panel was negative. CT angio in April showed further improvement in clot burden.  She will complete 1 year of full dose anticoagulation in December and will change to maintenance dose at that time.   Follow-up in 5 months.  She can contact our office with any questions or concerns.   Emeline Gins, NP 7/25/202210:06 AM

## 2021-02-03 ENCOUNTER — Other Ambulatory Visit: Payer: Self-pay

## 2021-02-03 ENCOUNTER — Ambulatory Visit (INDEPENDENT_AMBULATORY_CARE_PROVIDER_SITE_OTHER): Payer: Federal, State, Local not specified - PPO | Admitting: Primary Care

## 2021-02-03 VITALS — BP 128/80 | HR 89 | Temp 97.9°F | Ht 65.0 in | Wt 301.0 lb

## 2021-02-03 DIAGNOSIS — I2692 Saddle embolus of pulmonary artery without acute cor pulmonale: Secondary | ICD-10-CM

## 2021-02-03 DIAGNOSIS — Z6841 Body Mass Index (BMI) 40.0 and over, adult: Secondary | ICD-10-CM | POA: Diagnosis not present

## 2021-02-03 DIAGNOSIS — G4733 Obstructive sleep apnea (adult) (pediatric): Secondary | ICD-10-CM | POA: Diagnosis not present

## 2021-02-03 NOTE — Assessment & Plan Note (Signed)
-   Diagnosed with bilateral pulmonary embolism with right heart strain and left lower extremity DVT in December 2021.  Pulmonary embolism felt to be unprovoked. Hypercoag panel was negative. Genetic testing was negative. Plan continue full dose anticoagulation for 1 year until December 2022 and then change to maintenance dose. Following with Hematology.

## 2021-02-03 NOTE — Assessment & Plan Note (Signed)
-   Referring to healthy weight and wellness 

## 2021-02-03 NOTE — Progress Notes (Signed)
@[name]  ID: , female    DOB: 18-Mar-1971, 50 y.o.   MRN: [id number]  Chief Complaint  [name] presents with   Follow-up    Referring provider: [id number], MD  HPI: 50 year old female, never smoked.  Past medical history significant for acute saddle PE.  [name] of Dr. 52, last seen in office on 09/19/2020.  She was diagnosed with bilateral pulmonary embolism with right heart strain and left lower extremity DVT in December 2021.  Pulmonary embolism felt to be unprovoked.  Hyper coag panel was negative.  Genetic tests for predisposition to blood clots have been negative.  She was referred to hematology during her last visit for their opinion on duration of anticoagulation.  Repeat TTE 09/12/2020 showed normal RV function, normal right-sided pressures which is very reassuring with treatment for PE.    02/03/2021- Interim hx  [name] presents today for overdue 3 month follow-up.  She saw hematology in July 2022. CTA in April showed further improvement in clot burden.  Ultrasound LLE was negative for DVT in in April 2022. She will complete 1 year of full dose anticoagulation in December and will change to maintenance dose at that time.  Currently maintained on Xarelto 20 mg p.o. daily. She has no shortness of breath except when walking. She has been watching what she eats but her weight is not going down. She just got a treadmill and plans on starting en exercise regimen. She also plans on seeing a nutritionist. HST in February 2022 showed mild OSA, never started on CPAP. Her daughter states that her snoring has greatly improved . She tells me that she has been following with GYN and had MRI that showed scar tissue. Denies bleeding, chest pain, chest tightness, wheezing, cough, pelvic pain.   Sleep testing: 08/08/20 HST >> AHI 7.3/hr with SpO2 low 79%   Imaging: April 28th, 2022>> Ultrasound LLE was negative for DVT Aprill 28th, 2022>> CTA showed further improvement in clot  burden since 06/03/2020. No evidence of new emboli.  Cardiac: 09/12/2020 TTE >> normal RV function, normal right-sided pressures   Allergies  Allergen Reactions   Sulfa Antibiotics Rash    Immunization History  Administered Date(s) Administered   Hepatitis A 12/19/2020, 01/17/2021   Hepatitis B 12/19/2020, 01/17/2021   IPV 12/19/2020   PFIZER(Purple Top)SARS-COV-2 Vaccination 10/07/2019, 10/31/2019, 07/25/2020   Tdap 10/06/2011, 10/10/2020   Typhoid Inactivated 12/19/2020   Yellow Fever 12/19/2020    Past Medical History:  Diagnosis Date   Allergic rhinitis    Cellulitis and abscess    H/O blood clots    Hypertension     Tobacco History: Social History   Tobacco Use  Smoking Status Never  Smokeless Tobacco Never   Counseling given: Not Answered   Outpatient Medications Prior to Visit  Medication Sig Dispense Refill   Magnesium 250 MG TABS Take 1 tablet by mouth daily.     Multiple Vitamin (MULTIVITAMIN WITH MINERALS) TABS tablet Take 1 tablet by mouth daily.     rivaroxaban (XARELTO) 20 MG TABS tablet Take 1 tablet (20 mg total) by mouth daily with supper. 30 tablet 3   No facility-administered medications prior to visit.      Review of Systems  Review of Systems  Constitutional: Negative.   HENT: Negative.    Respiratory: Negative.      Physical Exam  BP 128/80 (BP Location: Left Arm, [name] Position: Sitting, Cuff Size: Large)   Pulse 89   Temp 97.9 F (36.6 C) (Oral)  Ht 5\' 5"  (1.651 m)   Wt (!) 301 lb (136.5 kg)   SpO2 100%   BMI 50.09 kg/m  Physical Exam Constitutional:      General: She is not in acute distress.    Appearance: Normal appearance. She is obese. She is not ill-appearing.  HENT:     Mouth/Throat:     Mouth: Mucous membranes are moist.     Pharynx: Oropharynx is clear.  Cardiovascular:     Rate and Rhythm: Normal rate and regular rhythm.  Pulmonary:     Effort: Pulmonary effort is normal.     Breath sounds: Normal  breath sounds.  Musculoskeletal:        General: Normal range of motion.  Skin:    General: Skin is warm and dry.  Neurological:     General: No focal deficit present.     Mental Status: She is alert and oriented to person, place, and time. Mental status is at baseline.  Psychiatric:        Mood and Affect: Mood normal.        Behavior: Behavior normal.        Thought Content: Thought content normal.        Judgment: Judgment normal.     Lab Results:  CBC    Component Value Date/Time   WBC 6.5 01/20/2021 0924   WBC 5.0 06/03/2020 0320   RBC 4.42 01/20/2021 0924   HGB 12.7 01/20/2021 0924   HCT 38.9 01/20/2021 0924   PLT 238 01/20/2021 0924   MCV 88.0 01/20/2021 0924   MCH 28.7 01/20/2021 0924   MCHC 32.6 01/20/2021 0924   RDW 14.3 01/20/2021 0924   LYMPHSABS 2.1 01/20/2021 0924   MONOABS 0.6 01/20/2021 0924   EOSABS 0.1 01/20/2021 0924   BASOSABS 0.0 01/20/2021 0924    BMET    Component Value Date/Time   NA 140 01/20/2021 0924   K 3.8 01/20/2021 0924   CL 105 01/20/2021 0924   CO2 27 01/20/2021 0924   GLUCOSE 125 (H) 01/20/2021 0924   BUN 15 01/20/2021 0924   CREATININE 1.06 (H) 01/20/2021 0924   CALCIUM 9.8 01/20/2021 0924   GFRNONAA >60 01/20/2021 0924   GFRAA >60 04/21/2015 1825    BNP    Component Value Date/Time   BNP 466.8 (H) 05/30/2020 1520    ProBNP No results found for: PROBNP  Imaging: No results found.   Assessment & Plan:   Acute pulmonary embolism (HCC) - Diagnosed with bilateral pulmonary embolism with right heart strain and left lower extremity DVT in December 2021.  Pulmonary embolism felt to be unprovoked. Hypercoag panel was negative. Genetic testing was negative. Plan continue full dose anticoagulation for 1 year until December 2022 and then change to maintenance dose. Following with Hematology.   OSA (obstructive sleep apnea) - Hst in February 2022 showed mild OSA, AHI 7.3 - [name] has not significant symptoms. Reports  snoring has greatly improved. - Continue to recommend weight loss efforts and side sleeping position.  - Follow-up as needed   BMI 50.0-59.9, adult [facility]) - Referring to healthy weight and wellness      [facility], NP 02/03/2021

## 2021-02-03 NOTE — Patient Instructions (Addendum)
TTE 09/12/2020 showed normal RV function, normal right-sided pressures   CTA in April showed further improvement in blood clot burden  Continue on Xarelto 20 mg p.o. daily. Plan complete 1 year of full dose anticoagulation in December 2022 and will change to maintenance dose at that time. Hematology to manage   Referral: Healthy weight and wellness re: BMI 50   Follow-up: As needed only with our office if snoring/sleep worsens   Sleep Apnea Sleep apnea affects breathing during sleep. It causes breathing to stop for 10 seconds or more, or to become shallow. People with sleep apnea usually snoreloudly. It can also increase the risk of: Heart attack. Stroke. Being very overweight (obese). Diabetes. Heart failure. Irregular heartbeat. High blood pressure. The goal of treatment is to help you breathe normally again. What are the causes?  The most common cause of this condition is a collapsed or blocked airway. There are three kinds of sleep apnea: Obstructive sleep apnea. This is caused by a blocked or collapsed airway. Central sleep apnea. This happens when the brain does not send the right signals to the muscles that control breathing. Mixed sleep apnea. This is a combination of obstructive and central sleep apnea. What increases the risk? Being overweight. Smoking. Having a small airway. Being older. Being female. Drinking alcohol. Taking medicines to calm yourself (sedatives or tranquilizers). Having family members with the condition. Having a tongue or tonsils that are larger than normal. What are the signs or symptoms? Trouble staying asleep. Loud snoring. Headaches in the morning. Waking up gasping. Dry mouth or sore throat in the morning. Being sleepy or tired during the day. If you are sleepy or tired during the day, you may also: Not be able to focus your mind (concentrate). Forget things. Get angry a lot and have mood swings. Feel sad (depressed). Have changes  in your personality. Have less interest in sex, if you are female. Be unable to have an erection, if you are female. How is this treated?  Sleeping on your side. Using a medicine to get rid of mucus in your nose (decongestant). Avoiding the use of alcohol, medicines to help you relax, or certain pain medicines (narcotics). Losing weight, if needed. Changing your diet. Quitting smoking. Using a machine to open your airway while you sleep, such as: An oral appliance. This is a mouthpiece that shifts your lower jaw forward. A CPAP device. This device blows air through a mask when you breathe out (exhale). An EPAP device. This has valves that you put in each nostril. A BPAP device. This device blows air through a mask when you breathe in (inhale) and breathe out. Having surgery if other treatments do not work. Follow these instructions at home: Lifestyle Make changes that your doctor recommends. Eat a healthy diet. Lose weight if needed. Avoid alcohol, medicines to help you relax, and some pain medicines. Do not smoke or use any products that contain nicotine or tobacco. If you need help quitting, ask your doctor. General instructions Take over-the-counter and prescription medicines only as told by your doctor. If you were given a machine to use while you sleep, use it only as told by your doctor. If you are having surgery, make sure to tell your doctor you have sleep apnea. You may need to bring your device with you. Keep all follow-up visits. Contact a doctor if: The machine that you were given to use during sleep bothers you or does not seem to be working. You do not get  better. You get worse. Get help right away if: Your chest hurts. You have trouble breathing in enough air. You have an uncomfortable feeling in your back, arms, or stomach. You have trouble talking. One side of your body feels weak. A part of your face is hanging down. These symptoms may be an emergency. Get help  right away. Call your local emergency services (911 in the [place] Do not wait to see if the symptoms will go away. Do not drive yourself to the hospital. Summary This condition affects breathing during sleep. The most common cause is a collapsed or blocked airway. The goal of treatment is to help you breathe normally while you sleep. This information is not intended to replace advice given to you by your health care provider. Make sure you discuss any questions you have with your healthcare provider. Document Revised: 05/24/2020 Document Reviewed: 05/24/2020 Elsevier Patient Education  2022 [employer].

## 2021-02-03 NOTE — Assessment & Plan Note (Signed)
-   Hst in February 2022 showed mild OSA, AHI 7.3 - Patient has not significant symptoms. Reports snoring has greatly improved. - Continue to recommend weight loss efforts and side sleeping position.  - Follow-up as needed

## 2021-04-29 DIAGNOSIS — E119 Type 2 diabetes mellitus without complications: Secondary | ICD-10-CM | POA: Diagnosis not present

## 2021-04-29 DIAGNOSIS — R7303 Prediabetes: Secondary | ICD-10-CM | POA: Diagnosis not present

## 2021-04-29 DIAGNOSIS — Z1231 Encounter for screening mammogram for malignant neoplasm of breast: Secondary | ICD-10-CM | POA: Diagnosis not present

## 2021-04-29 DIAGNOSIS — Z0000 Encounter for general adult medical examination without abnormal findings: Secondary | ICD-10-CM | POA: Diagnosis not present

## 2021-05-21 ENCOUNTER — Other Ambulatory Visit: Payer: Self-pay | Admitting: *Deleted

## 2021-05-21 DIAGNOSIS — I824Z2 Acute embolism and thrombosis of unspecified deep veins of left distal lower extremity: Secondary | ICD-10-CM

## 2021-05-21 DIAGNOSIS — I2692 Saddle embolus of pulmonary artery without acute cor pulmonale: Secondary | ICD-10-CM

## 2021-05-21 MED ORDER — RIVAROXABAN 20 MG PO TABS
20.0000 mg | ORAL_TABLET | Freq: Every day | ORAL | 0 refills | Status: DC
Start: 2021-05-21 — End: 2021-06-20

## 2021-05-30 DIAGNOSIS — E119 Type 2 diabetes mellitus without complications: Secondary | ICD-10-CM | POA: Diagnosis not present

## 2021-06-18 ENCOUNTER — Inpatient Hospital Stay: Payer: Federal, State, Local not specified - PPO | Admitting: Family

## 2021-06-18 ENCOUNTER — Inpatient Hospital Stay: Payer: Federal, State, Local not specified - PPO

## 2021-06-18 DIAGNOSIS — Z1231 Encounter for screening mammogram for malignant neoplasm of breast: Secondary | ICD-10-CM | POA: Diagnosis not present

## 2021-06-20 ENCOUNTER — Inpatient Hospital Stay: Payer: Federal, State, Local not specified - PPO | Attending: Hematology & Oncology

## 2021-06-20 ENCOUNTER — Inpatient Hospital Stay (HOSPITAL_BASED_OUTPATIENT_CLINIC_OR_DEPARTMENT_OTHER): Payer: Federal, State, Local not specified - PPO | Admitting: Family

## 2021-06-20 ENCOUNTER — Other Ambulatory Visit: Payer: Self-pay

## 2021-06-20 ENCOUNTER — Encounter: Payer: Self-pay | Admitting: Family

## 2021-06-20 ENCOUNTER — Other Ambulatory Visit (HOSPITAL_BASED_OUTPATIENT_CLINIC_OR_DEPARTMENT_OTHER): Payer: Self-pay

## 2021-06-20 DIAGNOSIS — I824Z2 Acute embolism and thrombosis of unspecified deep veins of left distal lower extremity: Secondary | ICD-10-CM | POA: Diagnosis not present

## 2021-06-20 DIAGNOSIS — I82402 Acute embolism and thrombosis of unspecified deep veins of left lower extremity: Secondary | ICD-10-CM | POA: Diagnosis not present

## 2021-06-20 DIAGNOSIS — Z7901 Long term (current) use of anticoagulants: Secondary | ICD-10-CM | POA: Diagnosis not present

## 2021-06-20 DIAGNOSIS — I2692 Saddle embolus of pulmonary artery without acute cor pulmonale: Secondary | ICD-10-CM | POA: Insufficient documentation

## 2021-06-20 LAB — CBC WITH DIFFERENTIAL (CANCER CENTER ONLY)
Abs Immature Granulocytes: 0.01 10*3/uL (ref 0.00–0.07)
Basophils Absolute: 0 10*3/uL (ref 0.0–0.1)
Basophils Relative: 1 %
Eosinophils Absolute: 0.1 10*3/uL (ref 0.0–0.5)
Eosinophils Relative: 2 %
HCT: 38.2 % (ref 36.0–46.0)
Hemoglobin: 12.5 g/dL (ref 12.0–15.0)
Immature Granulocytes: 0 %
Lymphocytes Relative: 41 %
Lymphs Abs: 2.4 10*3/uL (ref 0.7–4.0)
MCH: 28.8 pg (ref 26.0–34.0)
MCHC: 32.7 g/dL (ref 30.0–36.0)
MCV: 88 fL (ref 80.0–100.0)
Monocytes Absolute: 0.5 10*3/uL (ref 0.1–1.0)
Monocytes Relative: 10 %
Neutro Abs: 2.7 10*3/uL (ref 1.7–7.7)
Neutrophils Relative %: 46 %
Platelet Count: 262 10*3/uL (ref 150–400)
RBC: 4.34 MIL/uL (ref 3.87–5.11)
RDW: 13.5 % (ref 11.5–15.5)
WBC Count: 5.7 10*3/uL (ref 4.0–10.5)
nRBC: 0 % (ref 0.0–0.2)

## 2021-06-20 LAB — CMP (CANCER CENTER ONLY)
ALT: 20 U/L (ref 0–44)
AST: 15 U/L (ref 15–41)
Albumin: 3.8 g/dL (ref 3.5–5.0)
Alkaline Phosphatase: 94 U/L (ref 38–126)
Anion gap: 6 (ref 5–15)
BUN: 12 mg/dL (ref 6–20)
CO2: 29 mmol/L (ref 22–32)
Calcium: 9.5 mg/dL (ref 8.9–10.3)
Chloride: 104 mmol/L (ref 98–111)
Creatinine: 1.01 mg/dL — ABNORMAL HIGH (ref 0.44–1.00)
GFR, Estimated: >60 mL/min (ref >60)
Glucose, Bld: 119 mg/dL — ABNORMAL HIGH (ref 70–99)
Potassium: 3.9 mmol/L (ref 3.5–5.1)
Sodium: 139 mmol/L (ref 135–145)
Total Bilirubin: 0.6 mg/dL (ref 0.3–1.2)
Total Protein: 7.2 g/dL (ref 6.5–8.1)

## 2021-06-20 MED ORDER — RIVAROXABAN 10 MG PO TABS
10.0000 mg | ORAL_TABLET | Freq: Every day | ORAL | 5 refills | Status: DC
  Filled 2021-06-20: qty 30, 30d supply, fill #0

## 2021-06-20 NOTE — Progress Notes (Signed)
Hematology and Oncology Follow Up Visit  [name] [id number] [birth year] 50 y.o. 06/20/2021   Principle Diagnosis:  Acute bilateral saddle PE with right heart strain and left lower extremity DVT   Current Therapy:        Xarelto 20 mg PO daily   Interim History:  [name] is here today for follow-up. She is doing quite well and has no complaints at this time.  No blood loss noted.  No bruising or petechiae.  No fever, chills, n/v, cough, rash, dizziness, SOB, chest pain, palpitations, abdominal pain or changes in bowel or bladder habits.  No swelling, tenderness, numbness or tingling in her extremities.  No falls or syncope to report.  Her appetite is ok. She is staying well hydrated. Her weight is 304 lbs.   ECOG Performance Status: 1 - Symptomatic but completely ambulatory  Medications:  Allergies as of 06/20/2021       Reactions   Sulfa Antibiotics Rash        Medication List        Accurate as of June 20, 2021  9:14 AM. If you have any questions, ask your nurse or doctor.          Magnesium 250 MG Tabs Take 1 tablet by mouth daily.   multivitamin with minerals Tabs tablet Take 1 tablet by mouth daily.   rivaroxaban 20 MG Tabs tablet Commonly known as: XARELTO Take 1 tablet (20 mg total) by mouth daily with supper.        Allergies:  Allergies  Allergen Reactions   Sulfa Antibiotics Rash    Past Medical History, Surgical history, Social history, and Family History were reviewed and updated.  Review of Systems: All other 10 point review of systems is negative.   Physical Exam:  vitals were not taken for this visit.   Wt Readings from Last 3 Encounters:  02/03/21 (!) 301 lb (136.5 kg)  01/20/21 299 lb (135.6 kg)  10/21/20 299 lb (135.6 kg)    Ocular: Sclerae unicteric, pupils equal, round and reactive to light Ear-nose-throat: Oropharynx clear, dentition fair Lymphatic: No cervical or supraclavicular adenopathy Lungs no  rales or rhonchi, good excursion bilaterally Heart regular rate and rhythm, no murmur appreciated Abd soft, nontender, positive bowel sounds MSK no focal spinal tenderness, no joint edema Neuro: non-focal, well-oriented, appropriate affect Breasts: Deferred   Lab Results  Component Value Date   WBC 5.7 06/20/2021   HGB 12.5 06/20/2021   HCT 38.2 06/20/2021   MCV 88.0 06/20/2021   PLT 262 06/20/2021   Lab Results  Component Value Date   FERRITIN 95 10/21/2020   IRON 65 10/21/2020   TIBC 269 10/21/2020   UIBC 204 10/21/2020   IRONPCTSAT 24 10/21/2020   Lab Results  Component Value Date   RBC 4.34 06/20/2021   No results found for: KPAFRELGTCHN, LAMBDASER, KAPLAMBRATIO No results found for: IGGSERUM, IGA, IGMSERUM No results found for: [name], SPEI   Chemistry      Component Value Date/Time   NA 140 01/20/2021 0924   K 3.8 01/20/2021 0924   CL 105 01/20/2021 0924   CO2 27 01/20/2021 0924   BUN 15 01/20/2021 0924   CREATININE 1.06 (H) 01/20/2021 0924      Component Value Date/Time   CALCIUM 9.8 01/20/2021 0924   ALKPHOS 88 01/20/2021 0924   AST 14 (L) 01/20/2021 0924   ALT 17 01/20/2021 0924   BILITOT 0.7 01/20/2021 9937  Impression and Plan: [name] is a very pleasant 50 yo African American female with diagnosis of bilateral pulmonary emboli with right heart strain and left lower extremity DVT involving the popliteal and posterior tibial veins in December 2021. Hyper coag panel was negative. CT angio in April showed further improvement in clot burden. We will get another CT angio in January to reassess.  We will now reduce her to maintenance Xarelto at 10 mg PO daily.  Follow-up in 6 months.   Eileen Stanford, NP 12/23/20229:14 AM

## 2021-06-24 ENCOUNTER — Other Ambulatory Visit: Payer: Self-pay | Admitting: Hematology & Oncology

## 2021-06-24 DIAGNOSIS — I2692 Saddle embolus of pulmonary artery without acute cor pulmonale: Secondary | ICD-10-CM

## 2021-06-24 DIAGNOSIS — I824Z2 Acute embolism and thrombosis of unspecified deep veins of left distal lower extremity: Secondary | ICD-10-CM

## 2021-07-04 ENCOUNTER — Encounter (HOSPITAL_BASED_OUTPATIENT_CLINIC_OR_DEPARTMENT_OTHER): Payer: Self-pay

## 2021-07-04 ENCOUNTER — Ambulatory Visit (HOSPITAL_BASED_OUTPATIENT_CLINIC_OR_DEPARTMENT_OTHER)
Admission: RE | Admit: 2021-07-04 | Discharge: 2021-07-04 | Disposition: A | Discharge status: Home / Self Care | Payer: Federal, State, Local not specified - PPO | Source: Ambulatory Visit | Attending: Family | Admitting: Family

## 2021-07-04 ENCOUNTER — Telehealth: Payer: Self-pay | Admitting: *Deleted

## 2021-07-04 ENCOUNTER — Other Ambulatory Visit: Payer: Self-pay

## 2021-07-04 DIAGNOSIS — I2692 Saddle embolus of pulmonary artery without acute cor pulmonale: Secondary | ICD-10-CM | POA: Diagnosis not present

## 2021-07-04 DIAGNOSIS — M47814 Spondylosis without myelopathy or radiculopathy, thoracic region: Secondary | ICD-10-CM | POA: Diagnosis not present

## 2021-07-04 DIAGNOSIS — I2699 Other pulmonary embolism without acute cor pulmonale: Secondary | ICD-10-CM | POA: Diagnosis not present

## 2021-07-04 DIAGNOSIS — M47812 Spondylosis without myelopathy or radiculopathy, cervical region: Secondary | ICD-10-CM | POA: Diagnosis not present

## 2021-07-04 MED ORDER — IOHEXOL 350 MG/ML SOLN
100.0000 mL | Freq: Once | INTRAVENOUS | Status: AC | PRN
  Administered 2021-07-04: 100 mL via INTRAVENOUS

## 2021-07-04 NOTE — Telephone Encounter (Signed)
As noted below by Lottie Dawson, NP, I informed the patient that there is NO PE!!! She verbalized understanding.

## 2021-07-04 NOTE — Telephone Encounter (Signed)
-----   Message from Celso Amy, NP sent at 07/04/2021 12:33 PM EST ----- No PE!!!!! WOOOOOO HOOOOO!!!!!!!   ----- Message ----- From: Interface, Rad Results In Sent: 07/04/2021   9:57 AM EST To: Celso Amy, NP

## 2021-07-24 ENCOUNTER — Other Ambulatory Visit (HOSPITAL_BASED_OUTPATIENT_CLINIC_OR_DEPARTMENT_OTHER): Payer: Self-pay

## 2021-08-15 ENCOUNTER — Other Ambulatory Visit (HOSPITAL_BASED_OUTPATIENT_CLINIC_OR_DEPARTMENT_OTHER): Payer: Self-pay

## 2021-09-01 ENCOUNTER — Other Ambulatory Visit: Payer: Self-pay | Admitting: *Deleted

## 2021-09-01 DIAGNOSIS — I2692 Saddle embolus of pulmonary artery without acute cor pulmonale: Secondary | ICD-10-CM

## 2021-09-01 DIAGNOSIS — I824Z2 Acute embolism and thrombosis of unspecified deep veins of left distal lower extremity: Secondary | ICD-10-CM

## 2021-09-01 MED ORDER — RIVAROXABAN 10 MG PO TABS: 10.0000 mg | ORAL_TABLET | Freq: Every day | ORAL | 5 refills | Status: DC

## 2021-12-04 ENCOUNTER — Telehealth: Payer: Self-pay | Admitting: *Deleted

## 2021-12-04 NOTE — Telephone Encounter (Signed)
Called patient to reschedule appointments - confirmed

## 2021-12-07 IMAGING — CT CT ANGIO CHEST
2 of 6 series · 19 of 46 positions shown · IV contrast (APPLIED)
Comparison: Four days ago

CLINICAL DATA: Follow-up pulmonary embolism clot bird

EXAM:
CT ANGIOGRAPHY CHEST WITH CONTRAST
TECHNIQUE: Multidetector CT imaging of the chest was performed using the
standard protocol during bolus administration of intravenous
contrast. Multiplanar CT image reconstructions and MIPs were
obtained to evaluate the vascular anatomy.
CONTRAST:  100mL OMNIPAQUE IOHEXOL 350 MG/ML SOLN

[Series 5: thins · axial · 0.65mm/px · z∈[-279,-27]mm · 17 of 278 slices shown]
[im 13/278  lung]
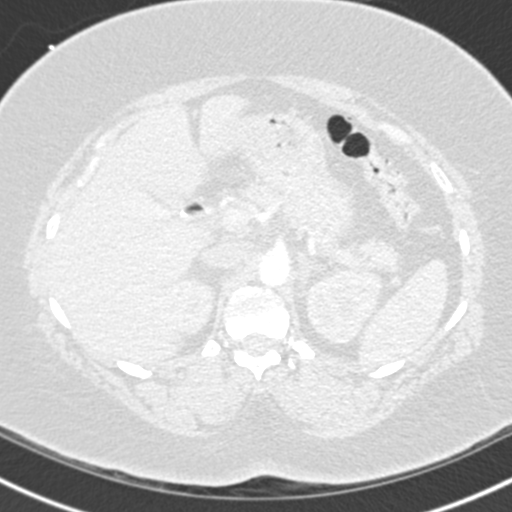
[im 25/278  soft-tissue]
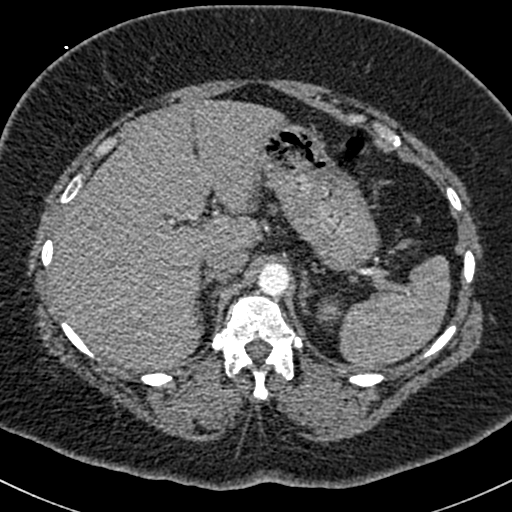
[im 49/278  lung]
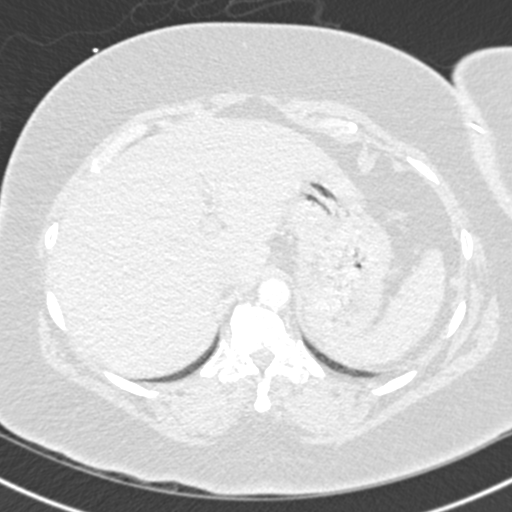
[im 61/278  soft-tissue]
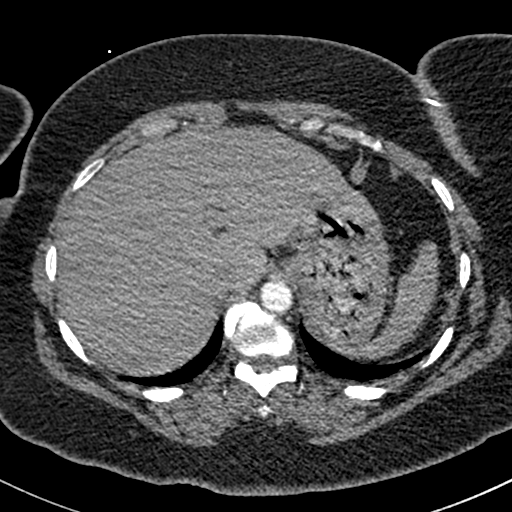
[im 73/278  lung]
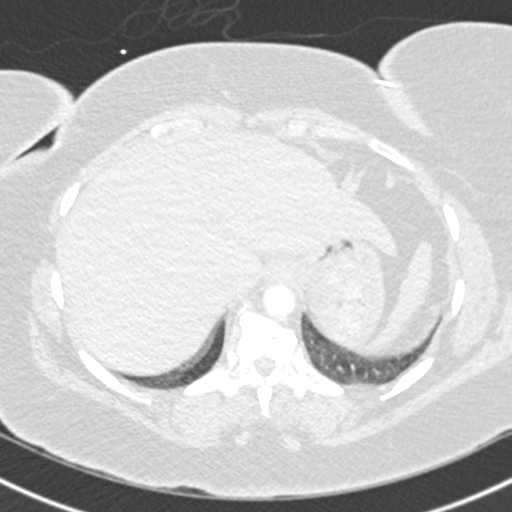
[im 97/278  soft-tissue]
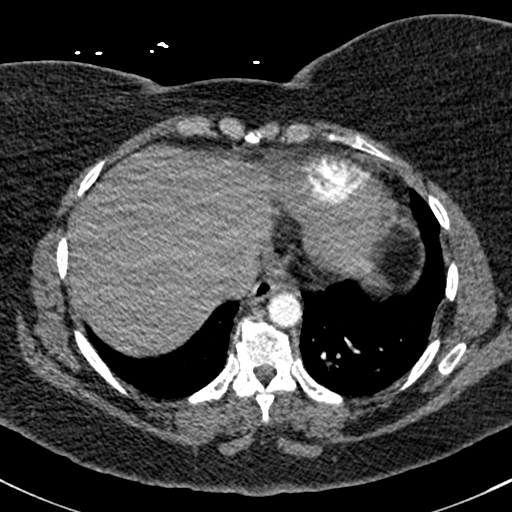
[im 109/278  lung]
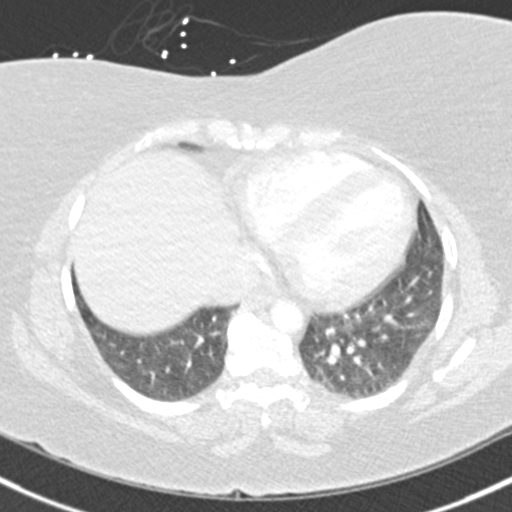
[im 121/278  soft-tissue]
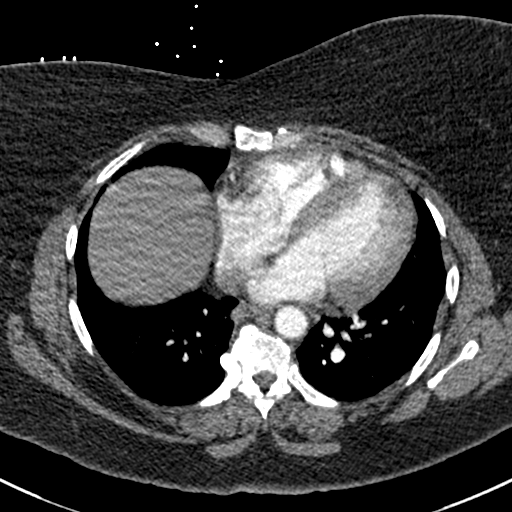
[im 145/278  lung]
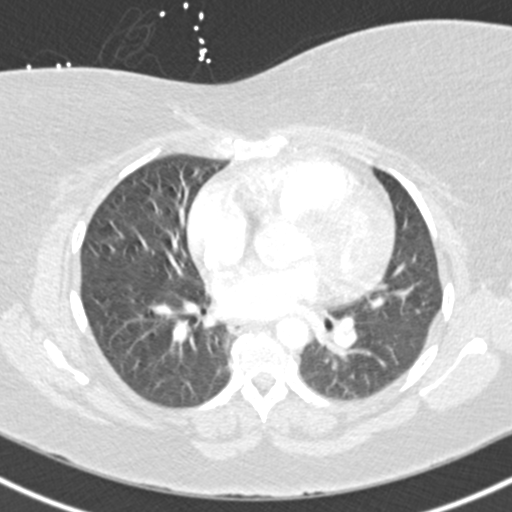
[im 157/278  soft-tissue]
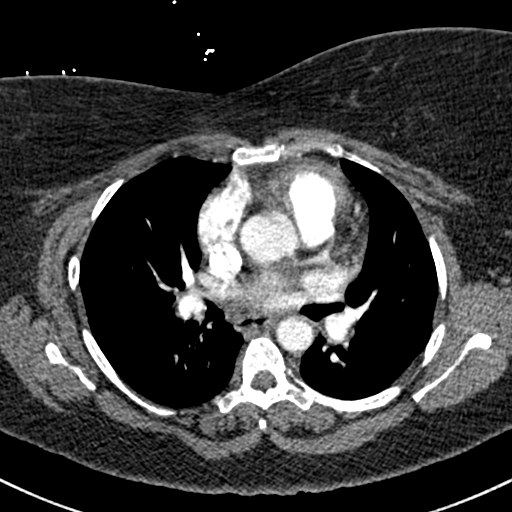
[im 169/278  lung]
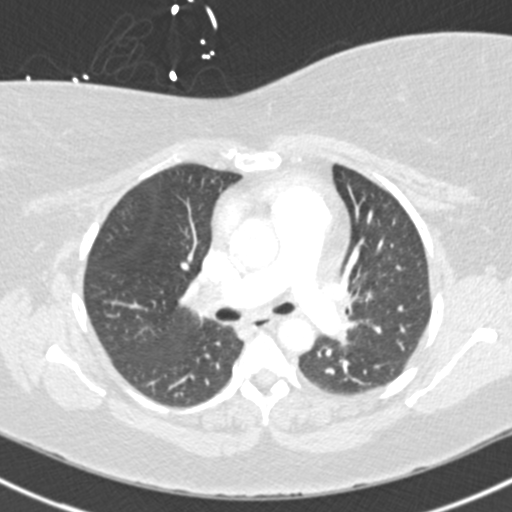
[im 181/278  soft-tissue]
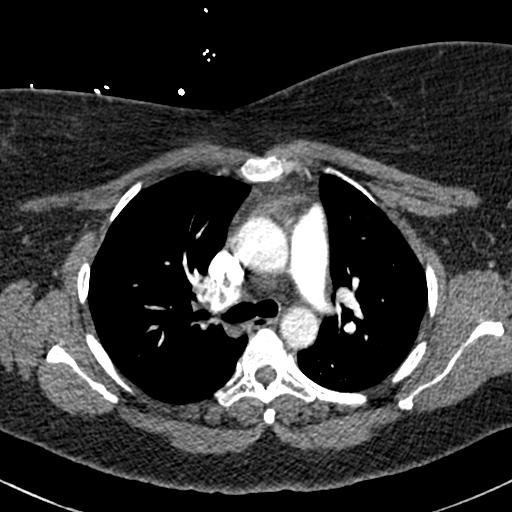
[im 205/278  lung]
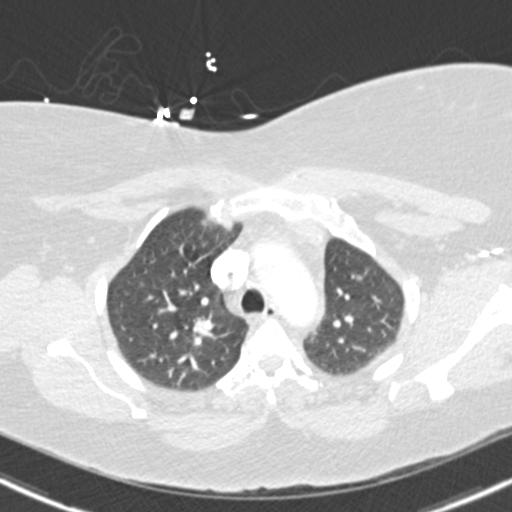
[im 217/278  soft-tissue]
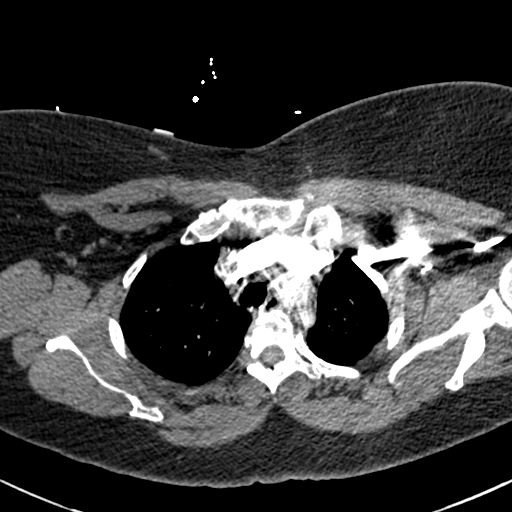
[im 229/278  lung]
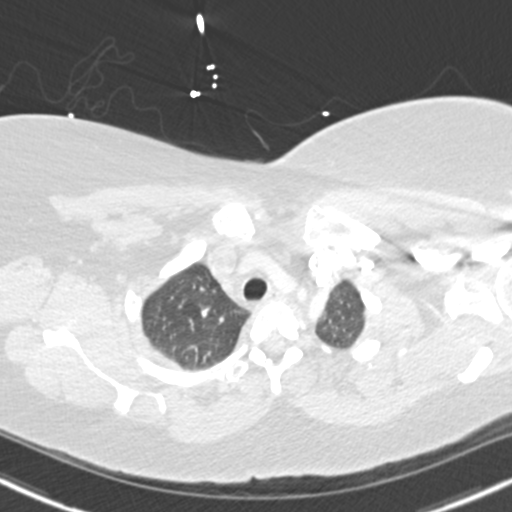
[im 253/278  soft-tissue]
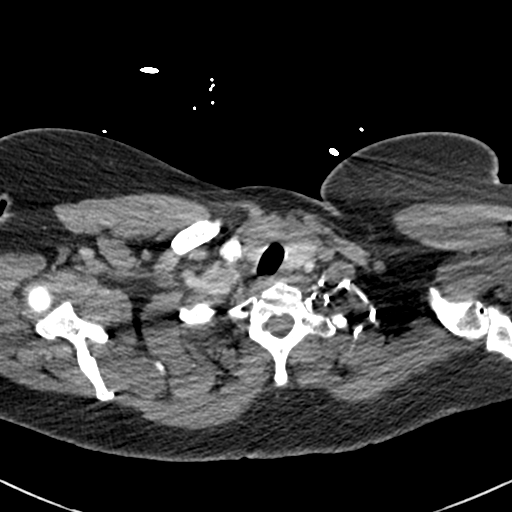
[im 265/278  lung]
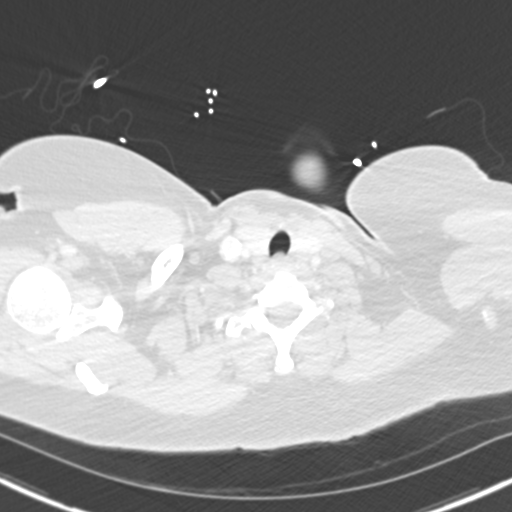

[Series 7: coronal mpr · coronal · 0.54mm/px · 2 of 89 slices shown]
[im 30/89  soft-tissue]
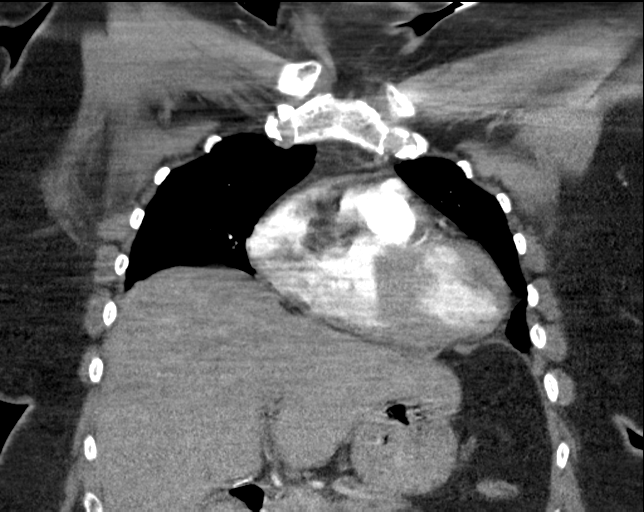
[im 59/89  soft-tissue]
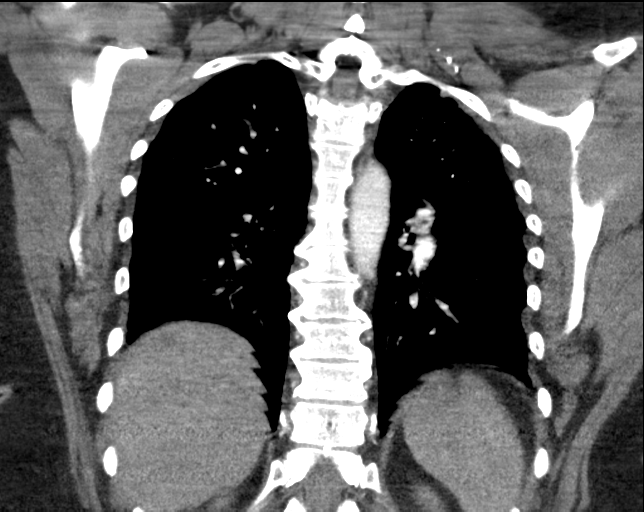

[19 of 46 positions shown; findings below may reference images not displayed]

FINDINGS: Cardiovascular: On prior there was central pulmonary emboli with
saddle clot. The saddle embolus has resolved and there is decreasing
main and lobar clot on both sides. The right heart and pulmonary
outflow track appear less distended. No pericardial effusion or
cardiomegaly.

Mediastinum/Nodes: Negative

Lungs/Pleura: No infarct or failure.  No effusion or pneumothorax.

Upper Abdomen: Cholecystectomy clips.

Musculoskeletal: Spondylosis.

Review of the MIP images confirms the above findings.
IMPRESSION: Significantly decreased clot burden. No evidence of interval emboli
or right heart failure.

## 2021-12-16 ENCOUNTER — Inpatient Hospital Stay: Payer: Federal, State, Local not specified - PPO | Admitting: Family

## 2021-12-16 ENCOUNTER — Inpatient Hospital Stay: Payer: Federal, State, Local not specified - PPO

## 2021-12-19 ENCOUNTER — Ambulatory Visit: Payer: Federal, State, Local not specified - PPO | Admitting: Family

## 2021-12-19 ENCOUNTER — Other Ambulatory Visit: Payer: Federal, State, Local not specified - PPO

## 2021-12-26 ENCOUNTER — Other Ambulatory Visit: Payer: Self-pay

## 2021-12-26 ENCOUNTER — Inpatient Hospital Stay: Payer: Federal, State, Local not specified - PPO | Attending: Hematology & Oncology

## 2021-12-26 ENCOUNTER — Telehealth: Payer: Self-pay | Admitting: *Deleted

## 2021-12-26 ENCOUNTER — Inpatient Hospital Stay (HOSPITAL_BASED_OUTPATIENT_CLINIC_OR_DEPARTMENT_OTHER): Payer: Federal, State, Local not specified - PPO | Admitting: Family

## 2021-12-26 ENCOUNTER — Encounter: Payer: Self-pay | Admitting: Family

## 2021-12-26 VITALS — BP 140/85 | HR 75 | Temp 98.2°F | Resp 18 | Ht 65.0 in | Wt 289.0 lb

## 2021-12-26 DIAGNOSIS — I2692 Saddle embolus of pulmonary artery without acute cor pulmonale: Secondary | ICD-10-CM

## 2021-12-26 DIAGNOSIS — I824Z2 Acute embolism and thrombosis of unspecified deep veins of left distal lower extremity: Secondary | ICD-10-CM

## 2021-12-26 DIAGNOSIS — I82402 Acute embolism and thrombosis of unspecified deep veins of left lower extremity: Secondary | ICD-10-CM | POA: Insufficient documentation

## 2021-12-26 DIAGNOSIS — Z7901 Long term (current) use of anticoagulants: Secondary | ICD-10-CM | POA: Diagnosis not present

## 2021-12-26 LAB — CBC WITH DIFFERENTIAL (CANCER CENTER ONLY)
Abs Immature Granulocytes: 0 10*3/uL (ref 0.00–0.07)
Basophils Absolute: 0.1 10*3/uL (ref 0.0–0.1)
Basophils Relative: 1 %
Eosinophils Absolute: 0.1 10*3/uL (ref 0.0–0.5)
Eosinophils Relative: 2 %
HCT: 38.4 % (ref 36.0–46.0)
Hemoglobin: 12.3 g/dL (ref 12.0–15.0)
Immature Granulocytes: 0 %
Lymphocytes Relative: 43 %
Lymphs Abs: 2 10*3/uL (ref 0.7–4.0)
MCH: 29.4 pg (ref 26.0–34.0)
MCHC: 32 g/dL (ref 30.0–36.0)
MCV: 91.6 fL (ref 80.0–100.0)
Monocytes Absolute: 0.5 10*3/uL (ref 0.1–1.0)
Monocytes Relative: 10 %
Neutro Abs: 2 10*3/uL (ref 1.7–7.7)
Neutrophils Relative %: 44 %
Platelet Count: 236 10*3/uL (ref 150–400)
RBC: 4.19 MIL/uL (ref 3.87–5.11)
RDW: 13.8 % (ref 11.5–15.5)
WBC Count: 4.6 10*3/uL (ref 4.0–10.5)
nRBC: 0 % (ref 0.0–0.2)

## 2021-12-26 LAB — CMP (CANCER CENTER ONLY)
ALT: 16 U/L (ref 0–44)
AST: 15 U/L (ref 15–41)
Albumin: 4 g/dL (ref 3.5–5.0)
Alkaline Phosphatase: 77 U/L (ref 38–126)
Anion gap: 9 (ref 5–15)
BUN: 10 mg/dL (ref 6–20)
CO2: 25 mmol/L (ref 22–32)
Calcium: 9.3 mg/dL (ref 8.9–10.3)
Chloride: 105 mmol/L (ref 98–111)
Creatinine: 1.06 mg/dL — ABNORMAL HIGH (ref 0.44–1.00)
GFR, Estimated: >60 mL/min (ref >60)
Glucose, Bld: 168 mg/dL — ABNORMAL HIGH (ref 70–99)
Potassium: 3.7 mmol/L (ref 3.5–5.1)
Sodium: 139 mmol/L (ref 135–145)
Total Bilirubin: 0.6 mg/dL (ref 0.3–1.2)
Total Protein: 7.2 g/dL (ref 6.5–8.1)

## 2021-12-26 NOTE — Progress Notes (Signed)
Hematology and Oncology Follow Up Visit  [name] [id number] 07-03-1970 51 y.o. 12/26/2021   Principle Diagnosis:  Acute bilateral saddle PE with right heart strain and left lower extremity DVT   Current Therapy:        Xarelto 10 mg PO daily   Interim History:  [name] is here today for follow-up. She is doing well and has no complaints at this time.  No blood loss noted. No bruising or petechiae.  No fever, chills, n/v, cough, rash, dizziness, SOB, chest pain, palpitations, abdominal pain or changes in bowel or bladder habits.  No swelling, tenderness, numbness or tingling in her extremities.  No falls or syncope.  Appetite and hydration are good. Her weight is stable at 289 lbs.   ECOG Performance Status: 1 - Symptomatic but completely ambulatory  Medications:  Allergies as of 12/26/2021       Reactions   Sulfa Antibiotics Rash        Medication List        Accurate as of December 26, 2021 10:09 AM. If you have any questions, ask your nurse or doctor.          Magnesium 250 MG Tabs Take 1 tablet by mouth daily.   multivitamin with minerals Tabs tablet Take 1 tablet by mouth daily.   Ozempic (1 MG/DOSE) 4 MG/3ML Sopn Generic drug: Semaglutide (1 MG/DOSE) Inject 1 mg into the skin once a week.   rivaroxaban 10 MG Tabs tablet Commonly known as: XARELTO Take 1 tablet (10 mg total) by mouth daily with supper.        Allergies:  Allergies  Allergen Reactions   Sulfa Antibiotics Rash    Past Medical History, Surgical history, Social history, and Family History were reviewed and updated.  Review of Systems: All other 10 point review of systems is negative.   Physical Exam:  vitals were not taken for this visit.   Wt Readings from Last 3 Encounters:  06/20/21 (!) 304 lb (137.9 kg)  02/03/21 (!) 301 lb (136.5 kg)  01/20/21 299 lb (135.6 kg)    Ocular: Sclerae unicteric, pupils equal, round and reactive to light Ear-nose-throat:  Oropharynx clear, dentition fair Lymphatic: No cervical or supraclavicular adenopathy Lungs no rales or rhonchi, good excursion bilaterally Heart regular rate and rhythm, no murmur appreciated Abd soft, nontender, positive bowel sounds MSK no focal spinal tenderness, no joint edema Neuro: non-focal, well-oriented, appropriate affect Breasts: Deferred   Lab Results  Component Value Date   WBC 5.7 06/20/2021   HGB 12.5 06/20/2021   HCT 38.2 06/20/2021   MCV 88.0 06/20/2021   PLT 262 06/20/2021   Lab Results  Component Value Date   FERRITIN 95 10/21/2020   IRON 65 10/21/2020   TIBC 269 10/21/2020   UIBC 204 10/21/2020   IRONPCTSAT 24 10/21/2020   Lab Results  Component Value Date   RBC 4.34 06/20/2021   No results found for: "KPAFRELGTCHN", "LAMBDASER", "KAPLAMBRATIO" No results found for: "IGGSERUM", "IGA", "IGMSERUM" No results found for: "TOTALPROTELP", "ALBUMINELP", "A1GS", "A2GS", "BETS", "BETA2SER", "GAMS", "MSPIKE", "SPEI"   Chemistry      Component Value Date/Time   NA 139 06/20/2021 0859   K 3.9 06/20/2021 0859   CL 104 06/20/2021 0859   CO2 29 06/20/2021 0859   BUN 12 06/20/2021 0859   CREATININE 1.01 (H) 06/20/2021 0859      Component Value Date/Time   CALCIUM 9.5 06/20/2021 0859   ALKPHOS 94 06/20/2021 0859   AST 15  06/20/2021 0859   ALT 20 06/20/2021 0859   BILITOT 0.6 06/20/2021 0859       Impression and Plan: [name] is a very pleasant 51 yo African American female with diagnosis of bilateral pulmonary emboli with right heart strain and left lower extremity DVT involving the popliteal and posterior tibial veins in December 2021. Hyper coag panel was negative. PE resolved on 07/04/2021 CT angio.  She will complete 1 year of maintenance Xarelto in December 2023.  If every thing is stable at 6 month follow-up we will then follow-up as needed.   Eileen Stanford, NP 6/30/202310:09 AM

## 2021-12-26 NOTE — Telephone Encounter (Signed)
Per 12/26/21 los - gave upcoming appointments - confirmed 

## 2022-03-14 ENCOUNTER — Other Ambulatory Visit: Payer: Self-pay | Admitting: Family

## 2022-03-14 DIAGNOSIS — I2692 Saddle embolus of pulmonary artery without acute cor pulmonale: Secondary | ICD-10-CM

## 2022-03-14 DIAGNOSIS — I824Z2 Acute embolism and thrombosis of unspecified deep veins of left distal lower extremity: Secondary | ICD-10-CM

## 2022-05-27 ENCOUNTER — Inpatient Hospital Stay (HOSPITAL_BASED_OUTPATIENT_CLINIC_OR_DEPARTMENT_OTHER): Payer: Federal, State, Local not specified - PPO | Admitting: Family

## 2022-05-27 ENCOUNTER — Inpatient Hospital Stay: Payer: Federal, State, Local not specified - PPO | Attending: Hematology & Oncology

## 2022-05-27 ENCOUNTER — Encounter: Payer: Self-pay | Admitting: Family

## 2022-05-27 VITALS — BP 125/85 | HR 65 | Temp 98.0°F | Resp 18 | Ht 65.0 in | Wt 278.0 lb

## 2022-05-27 DIAGNOSIS — I824Z2 Acute embolism and thrombosis of unspecified deep veins of left distal lower extremity: Secondary | ICD-10-CM

## 2022-05-27 DIAGNOSIS — Z7901 Long term (current) use of anticoagulants: Secondary | ICD-10-CM | POA: Diagnosis not present

## 2022-05-27 DIAGNOSIS — Z86718 Personal history of other venous thrombosis and embolism: Secondary | ICD-10-CM | POA: Insufficient documentation

## 2022-05-27 DIAGNOSIS — I2692 Saddle embolus of pulmonary artery without acute cor pulmonale: Secondary | ICD-10-CM

## 2022-05-27 DIAGNOSIS — Z86711 Personal history of pulmonary embolism: Secondary | ICD-10-CM | POA: Diagnosis not present

## 2022-05-27 LAB — CBC WITH DIFFERENTIAL (CANCER CENTER ONLY)
Abs Immature Granulocytes: 0.01 10*3/uL (ref 0.00–0.07)
Basophils Absolute: 0 10*3/uL (ref 0.0–0.1)
Basophils Relative: 1 %
Eosinophils Absolute: 0.1 10*3/uL (ref 0.0–0.5)
Eosinophils Relative: 1 %
HCT: 39.2 % (ref 36.0–46.0)
Hemoglobin: 12.5 g/dL (ref 12.0–15.0)
Immature Granulocytes: 0 %
Lymphocytes Relative: 35 %
Lymphs Abs: 1.8 10*3/uL (ref 0.7–4.0)
MCH: 29.3 pg (ref 26.0–34.0)
MCHC: 31.9 g/dL (ref 30.0–36.0)
MCV: 92 fL (ref 80.0–100.0)
Monocytes Absolute: 0.5 10*3/uL (ref 0.1–1.0)
Monocytes Relative: 10 %
Neutro Abs: 2.8 10*3/uL (ref 1.7–7.7)
Neutrophils Relative %: 53 %
Platelet Count: 262 10*3/uL (ref 150–400)
RBC: 4.26 MIL/uL (ref 3.87–5.11)
RDW: 13.8 % (ref 11.5–15.5)
WBC Count: 5.2 10*3/uL (ref 4.0–10.5)
nRBC: 0 % (ref 0.0–0.2)

## 2022-05-27 LAB — CMP (CANCER CENTER ONLY)
ALT: 17 U/L (ref 0–44)
AST: 12 U/L — ABNORMAL LOW (ref 15–41)
Albumin: 4.1 g/dL (ref 3.5–5.0)
Alkaline Phosphatase: 95 U/L (ref 38–126)
Anion gap: 6 (ref 5–15)
BUN: 9 mg/dL (ref 6–20)
CO2: 30 mmol/L (ref 22–32)
Calcium: 9.4 mg/dL (ref 8.9–10.3)
Chloride: 107 mmol/L (ref 98–111)
Creatinine: 0.94 mg/dL (ref 0.44–1.00)
GFR, Estimated: >60 mL/min (ref >60)
Glucose, Bld: 94 mg/dL (ref 70–99)
Potassium: 4.1 mmol/L (ref 3.5–5.1)
Sodium: 143 mmol/L (ref 135–145)
Total Bilirubin: 0.6 mg/dL (ref 0.3–1.2)
Total Protein: 6.9 g/dL (ref 6.5–8.1)

## 2022-05-27 NOTE — Progress Notes (Signed)
Hematology and Oncology Follow Up Visit  [name] [id number] [birth year] 51 y.o. 05/27/2022   Principle Diagnosis:  Acute bilateral saddle PE with right heart strain and left lower extremity DVT - all resolved per 10/25/2019 and 07/04/2021 repeat imaging    Current Therapy:        Xarelto 10 mg PO daily completed December 2023 2 EC baby aspirin PO daily with food - patient preference    Interim History:  [name] is here today for follow-up. She is doing quite well and has no complaints at this time.  She has tolerated maintenance Xarelto nicely. No issue with blood loss. No bruising or petechiae.  No fatigue.  No fever, chills, n/v, cough, rash, dizziness, SOB, chest pain, palpitations, abdominal pain or changes in bowel or bladder habits.  No swelling, tenderness, numbness or tingling in her extremities.  No falls or syncope.  Appetite and hydration are good. Weight is stable at 278 lbs.   ECOG Performance Status: 1 - Symptomatic but completely ambulatory  Medications:  Allergies as of 05/27/2022       Reactions   Sulfa Antibiotics Rash        Medication List        Accurate as of May 27, 2022  9:47 AM. If you have any questions, ask your nurse or doctor.          Black Currant Seed Oil 500 MG Caps Take 500 mg by mouth once.   Magnesium 250 MG Tabs Take 1 tablet by mouth daily.   multivitamin with minerals Tabs tablet Take 1 tablet by mouth daily.   Ozempic (1 MG/DOSE) 4 MG/3ML Sopn Generic drug: Semaglutide (1 MG/DOSE) Inject 1 mg into the skin once a week.   Xarelto 10 MG Tabs tablet Generic drug: rivaroxaban TAKE 1 TABLET (10 MG TOTAL) BY MOUTH DAILY WITH SUPPER.        Allergies:  Allergies  Allergen Reactions   Sulfa Antibiotics Rash    Past Medical History, Surgical history, Social history, and Family History were reviewed and updated.  Review of Systems: All other 10 point review of systems is negative.   Physical  Exam:  height is 5\' 5"  (1.651 m) and weight is 278 lb (126.1 kg). Her oral temperature is 98 F (36.7 C). Her blood pressure is 125/85 and her pulse is 65. Her respiration is 18 and oxygen saturation is 100%.   Wt Readings from Last 3 Encounters:  05/27/22 278 lb (126.1 kg)  12/26/21 289 lb (131.1 kg)  06/20/21 (!) 304 lb (137.9 kg)    Ocular: Sclerae unicteric, pupils equal, round and reactive to light Ear-nose-throat: Oropharynx clear, dentition fair Lymphatic: No cervical or supraclavicular adenopathy Lungs no rales or rhonchi, good excursion bilaterally Heart regular rate and rhythm, no murmur appreciated Abd soft, nontender, positive bowel sounds MSK no focal spinal tenderness, no joint edema Neuro: non-focal, well-oriented, appropriate affect Breasts: Deferred   Lab Results  Component Value Date   WBC 5.2 05/27/2022   HGB 12.5 05/27/2022   HCT 39.2 05/27/2022   MCV 92.0 05/27/2022   PLT 262 05/27/2022   Lab Results  Component Value Date   FERRITIN 95 10/21/2020   IRON 65 10/21/2020   TIBC 269 10/21/2020   UIBC 204 10/21/2020   IRONPCTSAT 24 10/21/2020   Lab Results  Component Value Date   RBC 4.26 05/27/2022   No results found for: "KPAFRELGTCHN", "LAMBDASER", "KAPLAMBRATIO" No results found for: "IGGSERUM", "IGA", "IGMSERUM" No results found  for: "TOTALPROTELP", "ALBUMINELP", "A1GS", "A2GS", "BETS", "BETA2SER", "GAMS", "MSPIKE", "SPEI"   Chemistry      Component Value Date/Time   NA 139 12/26/2021 1003   K 3.7 12/26/2021 1003   CL 105 12/26/2021 1003   CO2 25 12/26/2021 1003   BUN 10 12/26/2021 1003   CREATININE 1.06 (H) 12/26/2021 1003      Component Value Date/Time   CALCIUM 9.3 12/26/2021 1003   ALKPHOS 77 12/26/2021 1003   AST 15 12/26/2021 1003   ALT 16 12/26/2021 1003   BILITOT 0.6 12/26/2021 1003       Impression and Plan: Ms. Byrd is a very pleasant 51 yo African American female with diagnosis of bilateral pulmonary emboli with right  heart strain and left lower extremity DVT involving the popliteal and posterior tibial veins in December 2021. Hyper coag panel was negative. She would like to completed her Xarelto in December. She really does not want to be on lifelong anticoagulation.  We will have her start taking 2 EC baby aspirin PO daily with food.  Follow-up as needed.   Eileen Stanford, NP 11/29/20239:47 AM

## 2022-06-02 ENCOUNTER — Telehealth: Payer: Self-pay | Admitting: *Deleted

## 2022-06-02 NOTE — Telephone Encounter (Signed)
Per 05/27/22 LOS - Follow-up as needed.

## 2023-04-29 ENCOUNTER — Encounter (HOSPITAL_BASED_OUTPATIENT_CLINIC_OR_DEPARTMENT_OTHER): Payer: Self-pay | Admitting: Emergency Medicine

## 2023-04-29 ENCOUNTER — Emergency Department (HOSPITAL_BASED_OUTPATIENT_CLINIC_OR_DEPARTMENT_OTHER)
Admission: EM | Admit: 2023-04-29 | Discharge: 2023-04-29 | Disposition: A | Discharge status: Home / Self Care | Payer: Medicaid Other | Attending: Emergency Medicine | Admitting: Emergency Medicine

## 2023-04-29 ENCOUNTER — Other Ambulatory Visit: Payer: Self-pay

## 2023-04-29 ENCOUNTER — Emergency Department (HOSPITAL_BASED_OUTPATIENT_CLINIC_OR_DEPARTMENT_OTHER): Payer: Medicaid Other

## 2023-04-29 DIAGNOSIS — I80202 Phlebitis and thrombophlebitis of unspecified deep vessels of left lower extremity: Secondary | ICD-10-CM | POA: Insufficient documentation

## 2023-04-29 DIAGNOSIS — M79605 Pain in left leg: Secondary | ICD-10-CM | POA: Diagnosis present

## 2023-04-29 DIAGNOSIS — I8002 Phlebitis and thrombophlebitis of superficial vessels of left lower extremity: Secondary | ICD-10-CM

## 2023-04-29 DIAGNOSIS — Z7901 Long term (current) use of anticoagulants: Secondary | ICD-10-CM | POA: Diagnosis not present

## 2023-04-29 MED ORDER — ASPIRIN 81 MG PO TBEC: 162.0000 mg | DELAYED_RELEASE_TABLET | Freq: Every day | ORAL | 12 refills | Status: AC

## 2023-04-29 NOTE — Discharge Instructions (Signed)
Please read and follow all provided instructions.  Your diagnoses today include:  1. Thrombophlebitis of superficial veins of left lower extremity    Tests performed today include: Ultrasound of your leg shows superficial thrombophlebitis, no deep blood clots. Vital signs. See below for your results today.   Medications prescribed:  2 baby aspirin daily  Take any prescribed medications only as directed.  Home care instructions:  Follow any educational materials contained in this packet.  BE VERY CAREFUL not to take multiple medicines containing Tylenol (also called acetaminophen). Doing so can lead to an overdose which can damage your liver and cause liver failure and possibly death.   Follow-up instructions: Please follow-up with your primary care provider as needed for further evaluation of your symptoms.   Return instructions:  Please return to the Emergency Department if you experience worsening symptoms.  Return with worsening swelling, chest pain or shortness of breath. Please return if you have any other emergent concerns.  Additional Information:  Your vital signs today were: BP 135/68   Pulse (!) 56   Temp 97.9 F (36.6 C)   Resp 18   Wt 125.6 kg   SpO2 98%   BMI 46.10 kg/m  If your blood pressure (BP) was elevated above 135/85 this visit, please have this repeated by your doctor within one month. --------------

## 2023-04-29 NOTE — ED Provider Notes (Signed)
[facility] Provider Note   CSN: [id number] Arrival date & time: 04/29/23  5366     History  Chief Complaint  Patient presents with   Leg Pain    Left     [name] is a 52 y.o. female.  Patient presents to the emergency department today for evaluation of left lower extremity swelling.  Patient has a history of DVT/PE.  Patient was on Xarelto up until December 2023.  She was asked to take aspirin, but she admits to not taking aspirin daily at the current time.  Patient had a flight home from [place] last week.  She developed some pain in her ankle which then spread up into her lower leg.  She can feel a lump in the anterior lower leg.  She denies chest pain, shortness of breath.  No hemoptysis.  Previous workup included hypercoagulable panel which was negative.  She does not have a family history of thrombosis.       Home Medications Prior to Admission medications   Medication Sig Start Date End Date Taking? Authorizing Provider  Black Currant Seed Oil 500 MG CAPS Take 500 mg by mouth once. 11/27/21   [provider]  Magnesium 250 MG TABS Take 1 tablet by mouth daily.    [provider]  Multiple Vitamin (MULTIVITAMIN WITH MINERALS) TABS tablet Take 1 tablet by mouth daily.    [provider]  OZEMPIC, 1 MG/DOSE, 4 MG/3ML SOPN Inject 1 mg into the skin once a week. 05/30/21   [provider]  XARELTO 10 MG TABS tablet TAKE 1 TABLET (10 MG TOTAL) BY MOUTH DAILY WITH SUPPER. 03/16/22   Josph Macho, MD      Allergies    Sulfa antibiotics    Review of Systems   Review of Systems  Physical Exam Updated Vital Signs BP (!) 141/92   Pulse 65   Temp 97.9 F (36.6 C)   Resp 18   Wt 125.6 kg   SpO2 98%   BMI 46.10 kg/m  Physical Exam Vitals and nursing note reviewed.  Constitutional:      Appearance: She is well-developed.  HENT:     Head: Normocephalic and atraumatic.  Eyes:      Conjunctiva/sclera: Conjunctivae normal.  Cardiovascular:     Rate and Rhythm: Normal rate.     Comments: No tachycardia, no murmurs. Pulmonary:     Effort: Pulmonary effort is normal. No respiratory distress.     Breath sounds: Normal breath sounds.  Musculoskeletal:     Cervical back: Normal range of motion and neck supple.     Left lower leg: Edema present.     Comments: Patient with swelling of the left ankle and calf area.  Patient does have a small cord palpable anterolateral lower leg.  Distal sensation intact.  Foot appears to be well-perfused.  Skin:    General: Skin is warm and dry.  Neurological:     Mental Status: She is alert.     ED Results / Procedures / Treatments   Labs (all labs ordered are listed, but only abnormal results are displayed) Labs Reviewed - No data to display  EKG None  Radiology US Venous Img Lower Unilateral Left  Result Date: 04/29/2023 CLINICAL DATA:  Left foot and calf pain.  History of prior DVT. EXAM: LEFT LOWER EXTREMITY VENOUS DOPPLER ULTRASOUND TECHNIQUE: Gray-scale sonography with graded compression, as well as color Doppler and duplex ultrasound were  performed to evaluate the lower extremity deep venous systems from the level of the common femoral vein and including the common femoral, femoral, profunda femoral, popliteal and calf veins including the posterior tibial, peroneal and gastrocnemius veins when visible. The superficial great saphenous vein was also interrogated. Spectral Doppler was utilized to evaluate flow at rest and with distal augmentation maneuvers in the common femoral, femoral and popliteal veins. COMPARISON:  Prior left lower extremity venous duplex ultrasound on 10/24/2020 FINDINGS: Contralateral Common Femoral Vein: Respiratory phasicity is normal and symmetric with the symptomatic side. No evidence of thrombus. Normal compressibility. Common Femoral Vein: No evidence of thrombus. Normal compressibility, respiratory  phasicity and response to augmentation. Saphenofemoral Junction: No evidence of thrombus. Normal compressibility and flow on color Doppler imaging. Profunda Femoral Vein: No evidence of thrombus. Normal compressibility and flow on color Doppler imaging. Femoral Vein: No evidence of thrombus. Normal compressibility, respiratory phasicity and response to augmentation. Popliteal Vein: No evidence of thrombus. Normal compressibility, respiratory phasicity and response to augmentation. Calf Veins: No evidence of thrombus. Normal compressibility and flow on color Doppler imaging. Superficial Great Saphenous Vein: Superficial thrombophlebitis of the left great saphenous vein from the mid calf level to the ankle. No significant associated communicating varicosities are identified by ultrasound. Venous Reflux:  None. Other Findings:  No abnormal fluid collections identified. IMPRESSION: 1. No evidence of left lower extremity deep vein thrombosis. 2. Superficial thrombophlebitis of the left great saphenous vein from the mid calf level to the ankle. Electronically Signed   By: Irish Lack M.D.   On: 04/29/2023 12:09    Procedures Procedures    Medications Ordered in ED Medications - No data to display  ED Course/ Medical Decision Making/ A&P    Patient seen and examined. History obtained directly from patient.   Labs/EKG: None ordered  Imaging: Ordered DVT study, left lower extremity.  Medications/Fluids: None ordered  Most recent vital signs reviewed and are as follows: BP (!) 141/92   Pulse 65   Temp 97.9 F (36.6 C)   Resp 18   Wt 125.6 kg   SpO2 98%   BMI 46.10 kg/m   Initial impression: Left lower extremity swelling, rule out DVT.  Clinically low concern for PE.  12:56 PM Reassessment performed. Patient appears stable.  Ultrasound shows superficial thrombophlebitis.  Patient discussed with Dr. Earlene Plater.  I also discussed with oncology oncall Dr. Bertis Ruddy.  They recommend 2 baby aspirin  daily, do not recommend restarting anticoagulation at this time given superficial nature of the clot.  Reviewed pertinent lab work and imaging with patient at bedside. Questions answered.   Most current vital signs reviewed and are as follows: BP 135/68   Pulse (!) 56   Temp 97.9 F (36.6 C)   Resp 18   Wt 125.6 kg   SpO2 98%   BMI 46.10 kg/m   Plan: Discharge to home.   Prescriptions written for: 81 mg aspirin, 2 daily  Other home care instructions discussed: Elevation of extremity, warm compresses  ED return instructions discussed: Worsening pain, swelling, chest pain, shortness of breath, new or worsening symptoms  Follow-up instructions discussed: Patient encouraged to follow-up with their PCP as needed.                                   Medical Decision Making Risk OTC drugs.   Patient with superficial thrombophlebitis in setting of history of DVT/PE.  Anticoagulation stopped about 11 months ago.  Discussed with patient importance of adherence to aspirin regimen.  Recommendations from heme-onc.        Final Clinical Impression(s) / ED Diagnoses Final diagnoses:  Thrombophlebitis of superficial veins of left lower extremity    Rx / DC Orders ED Discharge Orders          Ordered    aspirin EC 81 MG tablet  Daily        04/29/23 1251              Renne Crigler, PA-C 04/29/23 1259    Laurence Spates, MD 04/30/23 [phone]

## 2023-04-29 NOTE — ED Triage Notes (Signed)
Left leg pain and swelling x 1 week , Hx DVT . Also had a 2 hr flight last week . Not on any blood thinners . Denies shortness of breath or chest pain .
